# Patient Record
Sex: Female | Born: 1985 | Race: White | Hispanic: No | Marital: Married | Smoking: Never smoker
Health system: Southern US, Community
[De-identification: ages and names within clinical notes are randomized; demographics above are authoritative.]

## PROBLEM LIST (undated history)

## (undated) DIAGNOSIS — R87629 Unspecified abnormal cytological findings in specimens from vagina: Secondary | ICD-10-CM

## (undated) DIAGNOSIS — Z789 Other specified health status: Secondary | ICD-10-CM

## (undated) HISTORY — DX: Unspecified abnormal cytological findings in specimens from vagina: R87.629

## (undated) HISTORY — PX: COLPOSCOPY: SHX161

## (undated) HISTORY — PX: WISDOM TOOTH EXTRACTION: SHX21

## (undated) HISTORY — DX: Other specified health status: Z78.9

## (undated) HISTORY — PX: OTHER SURGICAL HISTORY: SHX169

---

## 2017-01-04 ENCOUNTER — Encounter: Payer: Self-pay | Admitting: Obstetrics & Gynecology

## 2017-01-04 ENCOUNTER — Encounter (INDEPENDENT_AMBULATORY_CARE_PROVIDER_SITE_OTHER): Payer: Self-pay

## 2017-01-04 ENCOUNTER — Ambulatory Visit (INDEPENDENT_AMBULATORY_CARE_PROVIDER_SITE_OTHER): Payer: Managed Care, Other (non HMO) | Admitting: Obstetrics & Gynecology

## 2017-01-04 VITALS — BP 90/59 | HR 70 | Ht 62.0 in | Wt 117.0 lb

## 2017-01-04 DIAGNOSIS — Z3687 Encounter for antenatal screening for uncertain dates: Secondary | ICD-10-CM

## 2017-01-04 DIAGNOSIS — Z124 Encounter for screening for malignant neoplasm of cervix: Secondary | ICD-10-CM

## 2017-01-04 DIAGNOSIS — Z3481 Encounter for supervision of other normal pregnancy, first trimester: Secondary | ICD-10-CM

## 2017-01-04 DIAGNOSIS — Z1151 Encounter for screening for human papillomavirus (HPV): Secondary | ICD-10-CM | POA: Diagnosis not present

## 2017-01-04 DIAGNOSIS — Z3490 Encounter for supervision of normal pregnancy, unspecified, unspecified trimester: Secondary | ICD-10-CM

## 2017-01-04 DIAGNOSIS — Z113 Encounter for screening for infections with a predominantly sexual mode of transmission: Secondary | ICD-10-CM

## 2017-01-04 DIAGNOSIS — Z6791 Unspecified blood type, Rh negative: Secondary | ICD-10-CM

## 2017-01-04 DIAGNOSIS — O26899 Other specified pregnancy related conditions, unspecified trimester: Secondary | ICD-10-CM

## 2017-01-04 NOTE — Progress Notes (Signed)
Bedside U/S shows IUP with FHT of 171 BPM and CRL is 21.61mm.  GA 9w

## 2017-01-04 NOTE — Progress Notes (Signed)
  Subjective:    Amber Underwood is a G2P1001 [redacted]w[redacted]d being seen today for her first obstetrical visit.  Her obstetrical history is significant for NSVD one year ago---shorio during labor.  No probs with infant.. Patient does intend to breast feed. Pregnancy history fully reviewed.  Patient reports no complaints.  Vitals:   01/04/17 0856 01/04/17 0859  BP: (!) 90/59   Pulse: 70   Weight: 117 lb (53.1 kg)   Height:   (1.575 m)    HISTORY: OB History  Gravida Para Term Preterm AB Living  SAB TAB Ectopic Multiple Live Births               # Outcome Date GA Lbr Len/2nd Weight Sex Delivery Anes PTL Lv  2 Current           1 Term    6 lb 12 oz (3.062 kg) M Vag-Spont        Past Medical History:  Diagnosis Date  . Vaginal Pap smear, abnormal    Colpo normal   Past Surgical History:  Procedure Laterality Date  . WISDOM TOOTH EXTRACTION     Family History  Problem Relation Age of Onset  . Hypercholesterolemia Mother   . Prostate cancer Father   . Heart disease Father        Bypass  . Crohn's disease Sister   . Lung cancer Maternal Grandfather   . Breast cancer Paternal Grandmother   . Lung cancer Paternal Grandfather   . Breast cancer Paternal Aunt      Exam    Uterus:     Pelvic Exam:    Perineum: No Hemorrhoids, Normal Perineum   Vulva: normal   Vagina:  normal mucosa, normal discharge   pH: n/a   Cervix: no bleeding following Pap and no lesions   Adnexa: no mass, fullness, tenderness   Bony Pelvis: average  System: Breast:  normal appearance, no masses or tenderness   Skin: normal coloration and turgor, no rashes    Neurologic: oriented, normal mood   Extremities: normal strength, tone, and muscle mass   HEENT sclera clear, anicteric, oropharynx clear, no lesions, neck supple with midline trachea, thyroid without masses and trachea midline   Mouth/Teeth mucous membranes moist, pharynx normal without lesions and dental hygiene good   Neck  supple and no masses   Cardiovascular: regular rate and rhythm   Respiratory:  appears well, vitals normal, no respiratory distress, acyanotic, normal RR, chest clear, no wheezing, crepitations, rhonchi, normal symmetric air entry   Abdomen: soft, non-tender; bowel sounds normal; no masses,  no organomegaly   Urinary: urethral meatus normal      Assessment:    Pregnancy: G2P1001 Patient Active Problem List   Diagnosis Date Noted  . Supervision of low-risk pregnancy, unspecified trimester 01/04/2017        Plan:     Initial labs drawn. Prenatal vitamins. Problem list reviewed and updated. Genetic Screening discussed First Screen: ordered.  Ultrasound discussed; fetal survey: ordered.  Follow up in 4 weeks.  BRX-reduced schedule.  Agrees to weekly weights and BP.  Vegan--watch protein intake, vegan PNV  RH negative--Rhogam with bleeding,  weeks and .     Elsie Lincoln 01/04/2017

## 2017-01-04 NOTE — Progress Notes (Signed)
Pt will be on BRX opt scheduled

## 2017-01-05 LAB — OBSTETRIC PANEL
ANTIBODY SCREEN: NOT DETECTED
BASOS PCT: 0.6 %
Basophils Absolute: 39 cells/uL (ref 0–200)
EOS ABS: 20 {cells}/uL (ref 15–500)
Eosinophils Relative: 0.3 %
HEMATOCRIT: 36.5 % (ref 35.0–45.0)
Hemoglobin: 12.3 g/dL (ref 11.7–15.5)
Hepatitis B Surface Ag: NONREACTIVE
Lymphs Abs: 1976 cells/uL (ref 850–3900)
MCH: 29.4 pg (ref 27.0–33.0)
MCHC: 33.7 g/dL (ref 32.0–36.0)
MCV: 87.1 fL (ref 80.0–100.0)
MPV: 11 fL (ref 7.5–12.5)
Monocytes Relative: 4 %
NEUTROS PCT: 64.7 %
Neutro Abs: 4206 cells/uL (ref 1500–7800)
PLATELETS: 166 10*3/uL (ref 140–400)
RBC: 4.19 10*6/uL (ref 3.80–5.10)
RDW: 12 % (ref 11.0–15.0)
RPR Ser Ql: NONREACTIVE
RUBELLA: 4.76 {index}
TOTAL LYMPHOCYTE: 30.4 %
WBC mixed population: 260 cells/uL (ref 200–950)
WBC: 6.5 10*3/uL (ref 3.8–10.8)

## 2017-01-05 LAB — URINE CULTURE
MICRO NUMBER:: 81073114
RESULT: NO GROWTH
SPECIMEN QUALITY: ADEQUATE

## 2017-01-05 LAB — GC/CHLAMYDIA PROBE AMP (~~LOC~~) NOT AT ARMC
CHLAMYDIA, DNA PROBE: NEGATIVE
NEISSERIA GONORRHEA: NEGATIVE

## 2017-01-05 LAB — HIV ANTIBODY (ROUTINE TESTING W REFLEX): HIV 1&2 Ab, 4th Generation: NONREACTIVE

## 2017-01-08 LAB — CYTOLOGY - PAP
DIAGNOSIS: NEGATIVE
HPV (WINDOPATH): NOT DETECTED

## 2017-01-09 DIAGNOSIS — O26899 Other specified pregnancy related conditions, unspecified trimester: Secondary | ICD-10-CM

## 2017-01-09 DIAGNOSIS — Z6791 Unspecified blood type, Rh negative: Secondary | ICD-10-CM | POA: Insufficient documentation

## 2017-01-25 ENCOUNTER — Telehealth: Payer: Self-pay | Admitting: *Deleted

## 2017-01-25 NOTE — Telephone Encounter (Signed)
-----   Message from Arne ClevelandMandy J Hutchinson, [place] sent at 01/25/2017  1:37 PM EDT ----- This pt is missing an email address in the system and [employer] has not been able to get her registration completed. Can you guys follow up with her to see if she will be able to stay on Optimized Schedule or not and let me know.   Thank you Angelica ChessmanMandy

## 2017-01-25 NOTE — Telephone Encounter (Signed)
LM on voicemail to call office concerning BRX

## 2017-01-29 ENCOUNTER — Encounter (HOSPITAL_COMMUNITY): Payer: Self-pay | Admitting: Obstetrics & Gynecology

## 2017-02-02 ENCOUNTER — Ambulatory Visit (INDEPENDENT_AMBULATORY_CARE_PROVIDER_SITE_OTHER): Payer: Managed Care, Other (non HMO) | Admitting: Advanced Practice Midwife

## 2017-02-02 VITALS — BP 105/66 | HR 63 | Wt 120.0 lb

## 2017-02-02 DIAGNOSIS — Z23 Encounter for immunization: Secondary | ICD-10-CM

## 2017-02-02 DIAGNOSIS — Z3480 Encounter for supervision of other normal pregnancy, unspecified trimester: Secondary | ICD-10-CM

## 2017-02-02 DIAGNOSIS — Z3490 Encounter for supervision of normal pregnancy, unspecified, unspecified trimester: Secondary | ICD-10-CM

## 2017-02-02 DIAGNOSIS — Z3481 Encounter for supervision of other normal pregnancy, first trimester: Secondary | ICD-10-CM

## 2017-02-02 NOTE — Progress Notes (Signed)
   PRENATAL VISIT NOTE  Subjective:  Amber SabinaJessica Underwood is a 31 y.o. G2P1001 at 1840w1d being seen today for ongoing prenatal care.  She is currently monitored for the following issues for this low-risk pregnancy and has Supervision of low-risk pregnancy, unspecified trimester and Rh negative state in antepartum period on her problem list.  Patient reports no complaints.  Contractions: Not present. Vag. Bleeding: None.  Movement: Absent. Denies leaking of fluid.   The following portions of the patient's history were reviewed and updated as appropriate: allergies, current medications, past family history, past medical history, past social history, past surgical history and problem list. Problem list updated.  Objective:   Vitals:   02/02/17 0832  BP: 105/66  Pulse: 63  Weight: 120 lb (54.4 kg)    Fetal Status: Fetal Heart Rate (bpm): 154   Movement: Absent     General:  Alert, oriented and cooperative. Patient is in no acute distress.  Skin: Skin is warm and dry. No rash noted.   Cardiovascular: Normal heart rate noted  Respiratory: Normal respiratory effort, no problems with respiration noted  Abdomen: Soft, gravid, appropriate for gestational age.  Pain/Pressure: Absent     Pelvic: Cervical exam deferred        Extremities: Normal range of motion.  Edema: None  Mental Status:  Normal mood and affect. Normal behavior. Normal judgment and thought content.   Assessment and Plan:  Pregnancy: G2P1001 at 4040w1d  1. Supervision of other normal pregnancy, antepartum - Flu vax today  - US MFM OB COMP ADDL GEST + 14 WK; Future  Preterm labor symptoms and general obstetric precautions including but not limited to vaginal bleeding, contractions, leaking of fluid and fetal movement were reviewed in detail with the patient. Please refer to After Visit Summary for other counseling recommendations.  Return in about 4 weeks (around 03/02/2017).   Thressa ShellerHeather Hogan, CNM

## 2017-02-05 ENCOUNTER — Ambulatory Visit (HOSPITAL_COMMUNITY): Payer: Managed Care, Other (non HMO)

## 2017-02-05 NOTE — Addendum Note (Signed)
Addended by: Kathie DikeSOLA, Kycen Spalla J on: 02/05/2017 08:33 AM   Modules accepted: Orders

## 2017-02-06 ENCOUNTER — Encounter (HOSPITAL_COMMUNITY): Payer: Self-pay

## 2017-02-06 ENCOUNTER — Ambulatory Visit (HOSPITAL_COMMUNITY)
Admission: RE | Admit: 2017-02-06 | Discharge: 2017-02-06 | Disposition: A | Discharge status: Home / Self Care | Payer: Managed Care, Other (non HMO) | Source: Ambulatory Visit | Attending: Obstetrics & Gynecology | Admitting: Obstetrics & Gynecology

## 2017-02-06 ENCOUNTER — Other Ambulatory Visit: Payer: Self-pay | Admitting: Obstetrics & Gynecology

## 2017-02-06 DIAGNOSIS — Z3A14 14 weeks gestation of pregnancy: Secondary | ICD-10-CM

## 2017-02-06 DIAGNOSIS — Z3490 Encounter for supervision of normal pregnancy, unspecified, unspecified trimester: Secondary | ICD-10-CM

## 2017-02-06 DIAGNOSIS — Z3682 Encounter for antenatal screening for nuchal translucency: Secondary | ICD-10-CM | POA: Diagnosis not present

## 2017-02-08 ENCOUNTER — Other Ambulatory Visit: Payer: Self-pay

## 2017-02-12 ENCOUNTER — Encounter: Payer: Self-pay | Admitting: *Deleted

## 2017-02-12 DIAGNOSIS — Z3490 Encounter for supervision of normal pregnancy, unspecified, unspecified trimester: Secondary | ICD-10-CM

## 2017-02-19 DIAGNOSIS — Z3490 Encounter for supervision of normal pregnancy, unspecified, unspecified trimester: Secondary | ICD-10-CM

## 2017-03-15 ENCOUNTER — Ambulatory Visit (HOSPITAL_COMMUNITY): Payer: Managed Care, Other (non HMO)

## 2017-03-22 ENCOUNTER — Ambulatory Visit (HOSPITAL_COMMUNITY)
Admission: RE | Admit: 2017-03-22 | Discharge: 2017-03-22 | Disposition: A | Discharge status: Home / Self Care | Payer: Managed Care, Other (non HMO) | Source: Ambulatory Visit | Attending: Obstetrics & Gynecology | Admitting: Obstetrics & Gynecology

## 2017-03-22 ENCOUNTER — Ambulatory Visit (HOSPITAL_COMMUNITY): Payer: Managed Care, Other (non HMO)

## 2017-03-22 ENCOUNTER — Other Ambulatory Visit: Payer: Self-pay | Admitting: Obstetrics & Gynecology

## 2017-03-22 DIAGNOSIS — Z3A20 20 weeks gestation of pregnancy: Secondary | ICD-10-CM

## 2017-03-22 DIAGNOSIS — Z363 Encounter for antenatal screening for malformations: Secondary | ICD-10-CM

## 2017-03-22 DIAGNOSIS — Z3480 Encounter for supervision of other normal pregnancy, unspecified trimester: Secondary | ICD-10-CM | POA: Insufficient documentation

## 2017-03-22 DIAGNOSIS — Z3689 Encounter for other specified antenatal screening: Secondary | ICD-10-CM

## 2017-03-22 DIAGNOSIS — O30009 Twin pregnancy, unspecified number of placenta and unspecified number of amniotic sacs, unspecified trimester: Secondary | ICD-10-CM

## 2017-03-30 ENCOUNTER — Ambulatory Visit (INDEPENDENT_AMBULATORY_CARE_PROVIDER_SITE_OTHER): Payer: Managed Care, Other (non HMO) | Admitting: Advanced Practice Midwife

## 2017-03-30 VITALS — BP 103/66 | HR 68 | Wt 130.0 lb

## 2017-03-30 DIAGNOSIS — Z3480 Encounter for supervision of other normal pregnancy, unspecified trimester: Secondary | ICD-10-CM

## 2017-03-30 MED ORDER — VITAMEDMD ONE RX/QUATREFOLIC 30-0.6-0.4-200 MG PO CAPS: 1.0000 | ORAL_CAPSULE | Freq: Every day | ORAL | 12 refills | Status: AC

## 2017-03-30 NOTE — Progress Notes (Signed)
   PRENATAL VISIT NOTE  Subjective:  Amber Underwood is a 31 y.o. G2P1001 at 7471w1d being seen today for ongoing prenatal care.  She is currently monitored for the following issues for this low-risk pregnancy and has Supervision of low-risk pregnancy, unspecified trimester and Rh negative state in antepartum period on their problem list.  Patient reports no complaints.  Contractions: Not present. Vag. Bleeding: None.  Movement: Present. Denies leaking of fluid.   The following portions of the patient's history were reviewed and updated as appropriate: allergies, current medications, past family history, past medical history, past social history, past surgical history and problem list. Problem list updated.  Objective:   Vitals:   03/30/17 0827  BP: 103/66  Pulse: 68  Weight: 130 lb (59 kg)    Fetal Status: Fetal Heart Rate (bpm): 144   Movement: Present     General:  Alert, oriented and cooperative. Patient is in no acute distress.  Skin: Skin is warm and dry. No rash noted.   Cardiovascular: Normal heart rate noted  Respiratory: Normal respiratory effort, no problems with respiration noted  Abdomen: Soft, gravid, appropriate for gestational age.  Pain/Pressure: Absent     Pelvic: Cervical exam deferred        Extremities: Normal range of motion.  Edema: None  Mental Status:  Normal mood and affect. Normal behavior. Normal judgment and thought content.   Assessment and Plan:  Pregnancy: G2P1001 at 3671w1d  1. Supervision of other normal pregnancy, antepartum - Prenat w/o A-FE-Methfol-FA-DHA (VITAMEDMD ONE RX/QUATREFOLIC) 30-0.6-0.4-200 MG CAPS; Take 1 tablet by mouth daily.  Dispense: 30 capsule; Refill: 12 - US MFM OB FOLLOW UP; Future - scheduled for 04/20/16 for complete anatomy evaluation  -Educated and discussed fetal movement and what to expect at this stage of pregnancy.   Preterm labor symptoms and general obstetric precautions including but not limited to vaginal bleeding,  contractions, leaking of fluid and fetal movement were reviewed in detail with the patient. Please refer to After Visit Summary for other counseling recommendations.  Return in about 7 weeks (around 05/18/2017) for ROB.   Sharyon CableVeronica C Joani Cosma, CNM

## 2017-03-30 NOTE — Patient Instructions (Signed)
Second Trimester of Pregnancy The second trimester is from week 13 through week 28, month 4 through 6. This is often the time in pregnancy that you feel your best. Often times, morning sickness has lessened or quit. You may have more energy, and you may get hungry more often. Your unborn baby (fetus) is growing rapidly. At the end of the sixth month, he or she is about 9 inches long and weighs about 1 pounds. You will likely feel the baby move (quickening) between 18 and 20 weeks of pregnancy. Follow these instructions at home:  Avoid all smoking, herbs, and alcohol. Avoid drugs not approved by your doctor.  Do not use any tobacco products, including cigarettes, chewing tobacco, and electronic cigarettes. If you need help quitting, ask your doctor. You may get counseling or other support to help you quit.  Only take medicine as told by your doctor. Some medicines are safe and some are not during pregnancy.  Exercise only as told by your doctor. Stop exercising if you start having cramps.  Eat regular, healthy meals.  Wear a good support bra if your breasts are tender.  Do not use hot tubs, steam rooms, or saunas.  Wear your seat belt when driving.  Avoid raw meat, uncooked cheese, and liter boxes and soil used by cats.  Take your prenatal vitamins.  Take 1500-2000 milligrams of calcium daily starting at the 20th week of pregnancy until you deliver your baby.  Try taking medicine that helps you poop (stool softener) as needed, and if your doctor approves. Eat more fiber by eating fresh fruit, vegetables, and whole grains. Drink enough fluids to keep your pee (urine) clear or pale yellow.  Take warm water baths (sitz baths) to soothe pain or discomfort caused by hemorrhoids. Use hemorrhoid cream if your doctor approves.  If you have puffy, bulging veins (varicose veins), wear support hose. Raise (elevate) your feet for 15 minutes, 3-4 times a day. Limit salt in your diet.  Avoid heavy  lifting, wear low heals, and sit up straight.  Rest with your legs raised if you have leg cramps or low back pain.  Visit your dentist if you have not gone during your pregnancy. Use a soft toothbrush to brush your teeth. Be gentle when you floss.  You can have sex (intercourse) unless your doctor tells you not to.  Go to your doctor visits. Get help if:  You feel dizzy.  You have mild cramps or pressure in your lower belly (abdomen).  You have a nagging pain in your belly area.  You continue to feel sick to your stomach (nauseous), throw up (vomit), or have watery poop (diarrhea).  You have bad smelling fluid coming from your vagina.  You have pain with peeing (urination). Get help right away if:  You have a fever.  You are leaking fluid from your vagina.  You have spotting or bleeding from your vagina.  You have severe belly cramping or pain.  You lose or gain weight rapidly.  You have trouble catching your breath and have chest pain.  You notice sudden or extreme puffiness (swelling) of your face, hands, ankles, feet, or legs.  You have not felt the baby move in over an hour.  You have severe headaches that do not go away with medicine.  You have vision changes. This information is not intended to replace advice given to you by your health care provider. Make sure you discuss any questions you have with your health care   provider. Document Released: 06/21/2009 Document Revised: 09/02/2015 Document Reviewed: 05/28/2012 Elsevier Interactive Patient Education  2017 Elsevier Inc.  

## 2017-04-20 ENCOUNTER — Ambulatory Visit (HOSPITAL_COMMUNITY): Payer: Managed Care, Other (non HMO)

## 2017-04-26 ENCOUNTER — Ambulatory Visit (HOSPITAL_COMMUNITY)
Admission: RE | Admit: 2017-04-26 | Discharge: 2017-04-26 | Disposition: A | Discharge status: Home / Self Care | Payer: Managed Care, Other (non HMO) | Source: Ambulatory Visit | Attending: Certified Nurse Midwife | Admitting: Certified Nurse Midwife

## 2017-04-26 ENCOUNTER — Other Ambulatory Visit: Payer: Self-pay | Admitting: Certified Nurse Midwife

## 2017-04-26 DIAGNOSIS — Z362 Encounter for other antenatal screening follow-up: Secondary | ICD-10-CM

## 2017-04-26 DIAGNOSIS — Z3480 Encounter for supervision of other normal pregnancy, unspecified trimester: Secondary | ICD-10-CM | POA: Diagnosis present

## 2017-04-26 DIAGNOSIS — Z3A25 25 weeks gestation of pregnancy: Secondary | ICD-10-CM | POA: Diagnosis not present

## 2017-05-18 ENCOUNTER — Encounter: Payer: Self-pay | Admitting: Certified Nurse Midwife

## 2017-05-18 ENCOUNTER — Ambulatory Visit (INDEPENDENT_AMBULATORY_CARE_PROVIDER_SITE_OTHER): Payer: Managed Care, Other (non HMO) | Admitting: Certified Nurse Midwife

## 2017-05-18 VITALS — BP 102/61 | HR 76 | Wt 136.0 lb

## 2017-05-18 DIAGNOSIS — Z3480 Encounter for supervision of other normal pregnancy, unspecified trimester: Secondary | ICD-10-CM

## 2017-05-18 DIAGNOSIS — O360930 Maternal care for other rhesus isoimmunization, third trimester, not applicable or unspecified: Secondary | ICD-10-CM | POA: Diagnosis not present

## 2017-05-18 DIAGNOSIS — M549 Dorsalgia, unspecified: Secondary | ICD-10-CM

## 2017-05-18 DIAGNOSIS — Z3483 Encounter for supervision of other normal pregnancy, third trimester: Secondary | ICD-10-CM | POA: Diagnosis not present

## 2017-05-18 DIAGNOSIS — O9989 Other specified diseases and conditions complicating pregnancy, childbirth and the puerperium: Secondary | ICD-10-CM | POA: Diagnosis not present

## 2017-05-18 DIAGNOSIS — Z3490 Encounter for supervision of normal pregnancy, unspecified, unspecified trimester: Secondary | ICD-10-CM

## 2017-05-18 DIAGNOSIS — O99891 Other specified diseases and conditions complicating pregnancy: Secondary | ICD-10-CM

## 2017-05-18 DIAGNOSIS — Z23 Encounter for immunization: Secondary | ICD-10-CM

## 2017-05-18 MED ORDER — RHO D IMMUNE GLOBULIN 1500 UNIT/2ML IJ SOSY
300.0000 ug | PREFILLED_SYRINGE | Freq: Once | INTRAMUSCULAR | Status: AC
  Administered 2017-05-18: 300 ug via INTRAMUSCULAR

## 2017-05-18 NOTE — Patient Instructions (Signed)

## 2017-05-18 NOTE — Progress Notes (Signed)
   PRENATAL VISIT NOTE  Subjective:  Amber Underwood is a 32 y.o. G2P1001 at 3664w1d being seen today for ongoing prenatal care.  She is currently monitored for the following issues for this low-risk pregnancy and has Supervision of low-risk pregnancy, unspecified trimester and Rh negative state in antepartum period on their problem list.  Patient reports backache. Pt states she had back pain with her first pregnancy as well once she got into her third trimester. Reports back pain being better with positions- not complicating sleep. Contractions: Not present. Vag. Bleeding: None.  Movement: Present. Denies leaking of fluid.   The following portions of the patient's history were reviewed and updated as appropriate: allergies, current medications, past family history, past medical history, past social history, past surgical history and problem list. Problem list updated.  Objective:   Vitals:   05/18/17 0835  BP: 102/61  Pulse: 76  Weight: 136 lb (61.7 kg)    Fetal Status: Fetal Heart Rate (bpm): 132 Fundal Height: 27 cm Movement: Present     General:  Alert, oriented and cooperative. Patient is in no acute distress.  Skin: Skin is warm and dry. No rash noted.   Cardiovascular: Normal heart rate noted  Respiratory: Normal respiratory effort, no problems with respiration noted  Abdomen: Soft, gravid, appropriate for gestational age.  Pain/Pressure: Present     Pelvic: Cervical exam deferred        Extremities: Normal range of motion.  Edema: None  Mental Status:  Normal mood and affect. Normal behavior. Normal judgment and thought content.   Assessment and Plan:  Pregnancy: G2P1001 at 6064w1d  1. Supervision of other normal pregnancy, antepartum - 2Hr GTT w/ 1 Hr Carpenter 75 g - Tdap vaccine greater than or equal to 7yo IM - HIV antibody (with reflex) - CBC - RPR - rho (d) immune globulin (RHIG/RHOPHYLAC) injection 300 mcg - Antibody screen  2. Supervision of low-risk pregnancy,  unspecified trimester - Pt is concerned with how big the practice is and seeing a "stranger" when it comes to labor- pt had a difficult last labor due to this situation. Pt would like to see a familiar face in labor. Reviewed practice set up as discussed in initial prenatal. Addressed that provider can agree to labor patient as long as it is mutual.  -Anticipatory guidance on next prenatal visit, [employer] optimization patient  3. Back pain affecting pregnancy in third trimester -Discussed positioning to decreased back pain. Tylenol as a pain relief methods. Offered pregnancy support belt for use daily- patient declined.   Preterm labor symptoms and general obstetric precautions including but not limited to vaginal bleeding, contractions, leaking of fluid and fetal movement were reviewed in detail with the patient. Please refer to After Visit Summary for other counseling recommendations.  Return in about 4 weeks (around 06/15/2017) for ROB.   Sharyon CableVeronica C Oneisha Ammons, CNM

## 2017-05-21 ENCOUNTER — Other Ambulatory Visit: Payer: Self-pay | Admitting: Certified Nurse Midwife

## 2017-05-21 DIAGNOSIS — O24419 Gestational diabetes mellitus in pregnancy, unspecified control: Secondary | ICD-10-CM | POA: Insufficient documentation

## 2017-05-21 DIAGNOSIS — O24410 Gestational diabetes mellitus in pregnancy, diet controlled: Secondary | ICD-10-CM

## 2017-05-21 DIAGNOSIS — Z3480 Encounter for supervision of other normal pregnancy, unspecified trimester: Secondary | ICD-10-CM

## 2017-05-21 LAB — CBC
HCT: 35.9 % (ref 35.0–45.0)
Hemoglobin: 12.2 g/dL (ref 11.7–15.5)
MCH: 31 pg (ref 27.0–33.0)
MCHC: 34 g/dL (ref 32.0–36.0)
MCV: 91.1 fL (ref 80.0–100.0)
MPV: 9.5 fL (ref 7.5–12.5)
Platelets: 224 10*3/uL (ref 140–400)
RBC: 3.94 10*6/uL (ref 3.80–5.10)
RDW: 11.4 % (ref 11.0–15.0)
WBC: 8.9 10*3/uL (ref 3.8–10.8)

## 2017-05-21 LAB — ANTIBODY SCREEN: Antibody Screen: NOT DETECTED

## 2017-05-21 LAB — 2HR GTT W 1 HR, CARPENTER, 75 G
Glucose, 1 Hr, Gest: 177 mg/dL (ref 65–179)
Glucose, 2 Hr, Gest: 161 mg/dL — ABNORMAL HIGH (ref 65–152)
Glucose, Fasting, Gest: 72 mg/dL (ref 65–91)

## 2017-05-21 LAB — HIV ANTIBODY (ROUTINE TESTING W REFLEX): HIV 1&2 Ab, 4th Generation: NONREACTIVE

## 2017-05-21 LAB — RPR: RPR Ser Ql: NONREACTIVE

## 2017-05-23 ENCOUNTER — Telehealth: Payer: Self-pay | Admitting: *Deleted

## 2017-05-23 ENCOUNTER — Other Ambulatory Visit: Payer: Self-pay | Admitting: Certified Nurse Midwife

## 2017-05-23 DIAGNOSIS — O24410 Gestational diabetes mellitus in pregnancy, diet controlled: Secondary | ICD-10-CM

## 2017-05-23 MED ORDER — ACCU-CHEK FASTCLIX LANCETS MISC: 1.0000 | Freq: Four times a day (QID) | 12 refills | Status: DC

## 2017-05-23 MED ORDER — ACCU-CHEK NANO SMARTVIEW W/DEVICE KIT: 1.0000 | PACK | 0 refills | Status: DC

## 2017-05-23 MED ORDER — BLOOD GLUCOSE MONITOR KIT: PACK | 0 refills | Status: DC

## 2017-05-23 MED ORDER — GLUCOSE BLOOD VI STRP: ORAL_STRIP | 12 refills | Status: DC

## 2017-05-23 NOTE — Telephone Encounter (Signed)
Pharmacy called stating that pt's insurance would only cover One Touch glucose testing supplies.

## 2017-05-23 NOTE — Progress Notes (Signed)
Changes made to glucose monitor and added lancets and strips  

## 2017-07-12 LAB — OB RESULTS CONSOLE GBS: STREP GROUP B AG: NEGATIVE

## 2017-08-06 ENCOUNTER — Encounter (HOSPITAL_COMMUNITY): Payer: Self-pay | Admitting: *Deleted

## 2017-08-06 ENCOUNTER — Telehealth (HOSPITAL_COMMUNITY): Payer: Self-pay | Admitting: *Deleted

## 2017-08-06 NOTE — Telephone Encounter (Signed)
Preadmission screen  

## 2017-08-10 ENCOUNTER — Inpatient Hospital Stay (HOSPITAL_COMMUNITY): Admission: RE | Admit: 2017-08-10 | Payer: Managed Care, Other (non HMO) | Source: Ambulatory Visit

## 2017-08-13 ENCOUNTER — Inpatient Hospital Stay (HOSPITAL_COMMUNITY): Admission: RE | Admit: 2017-08-13 | Payer: Managed Care, Other (non HMO) | Source: Ambulatory Visit

## 2017-08-15 NOTE — H&P (Signed)
Amber Underwood is a 32 y.o. female presenting for post dates IOL. Prenatal care uncomplicated. Transferred to our office at approximately [redacted] weeks EGA. U/S in office on 08/09/17 C/W EFW 7# 7oz, vertex. OB History    Gravida  2   Para  1   Term  1   Preterm      AB      Living  1     SAB      TAB      Ectopic      Multiple      Live Births  1          Past Medical History:  Diagnosis Date  . Vaginal Pap smear, abnormal    Colpo normal   Past Surgical History:  Procedure Laterality Date  . COLPOSCOPY    . WISDOM TOOTH EXTRACTION     Family History: family history includes Breast cancer in her paternal aunt; Crohn's disease in her sister; Heart disease in her father; Hypercholesterolemia in her mother; Lung cancer in her maternal grandfather and paternal grandfather; Prostate cancer in her father. Social History:  reports that she has never smoked. She has never used smokeless tobacco. She reports that she does not drink alcohol or use drugs.     Maternal Diabetes: No Genetic Screening: Normal Maternal Ultrasounds/Referrals: Normal Fetal Ultrasounds or other Referrals:  None Maternal Substance Abuse:  No Significant Maternal Medications:  None Significant Maternal Lab Results:  None Other Comments:  None  ROS History   Last menstrual period 10/23/2016. Exam Physical Exam  Cardiovascular: Normal rate.  Respiratory: Effort normal.  GI: Soft.    Prenatal labs: ABO, Rh: B/RH (D) NEGATIVE/-- (09/27 0942) Antibody: NO ANTIBODIES DETECTED (02/08 0916) Rubella: 4.76 (09/27 0942) RPR: NON-REACTIVE (02/08 0917)  HBsAg: NON-REACTIVE (09/27 0942)  HIV: NON-REACTIVE (02/08 0917)  GBS:     Assessment/Plan: 32 yo G2P1 @ 41 0/7 weeks for IOL   Amber Underwood II 08/15/2017, 1:42 PM

## 2017-08-16 ENCOUNTER — Inpatient Hospital Stay (HOSPITAL_COMMUNITY): Admission: RE | Admit: 2017-08-16 | Payer: Managed Care, Other (non HMO) | Source: Ambulatory Visit

## 2017-08-16 ENCOUNTER — Encounter (HOSPITAL_COMMUNITY): Payer: Self-pay | Admitting: *Deleted

## 2017-08-16 ENCOUNTER — Inpatient Hospital Stay (HOSPITAL_COMMUNITY): Payer: Managed Care, Other (non HMO) | Admitting: Anesthesiology

## 2017-08-16 ENCOUNTER — Other Ambulatory Visit: Payer: Self-pay

## 2017-08-16 ENCOUNTER — Inpatient Hospital Stay (HOSPITAL_COMMUNITY)
Admission: AD | Admit: 2017-08-16 | Discharge: 2017-08-17 | DRG: 807 | Disposition: A | Discharge status: Home / Self Care | Payer: Managed Care, Other (non HMO) | Source: Ambulatory Visit | Attending: Obstetrics and Gynecology | Admitting: Obstetrics and Gynecology

## 2017-08-16 DIAGNOSIS — Z3A41 41 weeks gestation of pregnancy: Secondary | ICD-10-CM

## 2017-08-16 DIAGNOSIS — O24410 Gestational diabetes mellitus in pregnancy, diet controlled: Secondary | ICD-10-CM

## 2017-08-16 DIAGNOSIS — Z3490 Encounter for supervision of normal pregnancy, unspecified, unspecified trimester: Secondary | ICD-10-CM

## 2017-08-16 DIAGNOSIS — O480 Post-term pregnancy: Principal | ICD-10-CM | POA: Diagnosis present

## 2017-08-16 LAB — CBC
HEMATOCRIT: 39.5 % (ref 36.0–46.0)
HEMOGLOBIN: 13.3 g/dL (ref 12.0–15.0)
MCH: 31.2 pg (ref 26.0–34.0)
MCHC: 33.7 g/dL (ref 30.0–36.0)
MCV: 92.7 fL (ref 78.0–100.0)
Platelets: 205 10*3/uL (ref 150–400)
RBC: 4.26 MIL/uL (ref 3.87–5.11)
RDW: 12.5 % (ref 11.5–15.5)
WBC: 9.4 10*3/uL (ref 4.0–10.5)

## 2017-08-16 LAB — ABO/RH: ABO/RH(D): B NEG

## 2017-08-16 LAB — TYPE AND SCREEN
ABO/RH(D): B NEG
ANTIBODY SCREEN: NEGATIVE

## 2017-08-16 LAB — RPR: RPR: NONREACTIVE

## 2017-08-16 MED ORDER — OXYTOCIN BOLUS FROM INFUSION
500.0000 mL | Freq: Once | INTRAVENOUS | Status: AC
  Administered 2017-08-16: 500 mL via INTRAVENOUS

## 2017-08-16 MED ORDER — OXYCODONE-ACETAMINOPHEN 5-325 MG PO TABS: 2.0000 | ORAL_TABLET | ORAL | Status: DC | PRN

## 2017-08-16 MED ORDER — LACTATED RINGERS IV SOLN: 500.0000 mL | INTRAVENOUS | Status: DC | PRN

## 2017-08-16 MED ORDER — LACTATED RINGERS IV SOLN
INTRAVENOUS | Status: DC
  Administered 2017-08-16: 05:00:00 via INTRAVENOUS

## 2017-08-16 MED ORDER — LACTATED RINGERS IV SOLN: 500.0000 mL | Freq: Once | INTRAVENOUS | Status: DC

## 2017-08-16 MED ORDER — PHENYLEPHRINE 40 MCG/ML (10ML) SYRINGE FOR IV PUSH (FOR BLOOD PRESSURE SUPPORT)
PREFILLED_SYRINGE | INTRAVENOUS | Status: AC
  Filled 2017-08-16: qty 20

## 2017-08-16 MED ORDER — ACETAMINOPHEN 325 MG PO TABS
650.0000 mg | ORAL_TABLET | ORAL | Status: DC | PRN
  Administered 2017-08-16 – 2017-08-17 (×6): 650 mg via ORAL
  Filled 2017-08-16 (×6): qty 2

## 2017-08-16 MED ORDER — LIDOCAINE HCL (PF) 1 % IJ SOLN
INTRAMUSCULAR | Status: DC | PRN
  Administered 2017-08-16 (×2): 4 mL via EPIDURAL

## 2017-08-16 MED ORDER — FLEET ENEMA 7-19 GM/118ML RE ENEM: 1.0000 | ENEMA | RECTAL | Status: DC | PRN

## 2017-08-16 MED ORDER — ZOLPIDEM TARTRATE 5 MG PO TABS: 5.0000 mg | ORAL_TABLET | Freq: Every evening | ORAL | Status: DC | PRN

## 2017-08-16 MED ORDER — LACTATED RINGERS IV SOLN
500.0000 mL | Freq: Once | INTRAVENOUS | Status: AC
  Administered 2017-08-16: 500 mL via INTRAVENOUS

## 2017-08-16 MED ORDER — WITCH HAZEL-GLYCERIN EX PADS: 1.0000 "application " | MEDICATED_PAD | CUTANEOUS | Status: DC | PRN

## 2017-08-16 MED ORDER — BENZOCAINE-MENTHOL 20-0.5 % EX AERO
1.0000 "application " | INHALATION_SPRAY | CUTANEOUS | Status: DC | PRN
  Administered 2017-08-16: 1 via TOPICAL
  Filled 2017-08-16 (×2): qty 56

## 2017-08-16 MED ORDER — PHENYLEPHRINE 40 MCG/ML (10ML) SYRINGE FOR IV PUSH (FOR BLOOD PRESSURE SUPPORT): 80.0000 ug | PREFILLED_SYRINGE | INTRAVENOUS | Status: DC | PRN

## 2017-08-16 MED ORDER — ONDANSETRON HCL 4 MG/2ML IJ SOLN: 4.0000 mg | INTRAMUSCULAR | Status: DC | PRN

## 2017-08-16 MED ORDER — SOD CITRATE-CITRIC ACID 500-334 MG/5ML PO SOLN: 30.0000 mL | ORAL | Status: DC | PRN

## 2017-08-16 MED ORDER — DIBUCAINE 1 % RE OINT
1.0000 "application " | TOPICAL_OINTMENT | RECTAL | Status: DC | PRN
  Filled 2017-08-16: qty 28

## 2017-08-16 MED ORDER — TETANUS-DIPHTH-ACELL PERTUSSIS 5-2.5-18.5 LF-MCG/0.5 IM SUSP
0.5000 mL | Freq: Once | INTRAMUSCULAR | Status: DC
Start: 2017-08-17 — End: 2017-08-16

## 2017-08-16 MED ORDER — FENTANYL 2.5 MCG/ML BUPIVACAINE 1/10 % EPIDURAL INFUSION (WH - ANES)
14.0000 mL/h | INTRAMUSCULAR | Status: DC | PRN
  Administered 2017-08-16: 12.5 mL/h via EPIDURAL

## 2017-08-16 MED ORDER — PRENATAL MULTIVITAMIN CH
1.0000 | ORAL_TABLET | Freq: Every day | ORAL | Status: DC
  Administered 2017-08-17: 1 via ORAL
  Filled 2017-08-16: qty 1

## 2017-08-16 MED ORDER — SENNOSIDES-DOCUSATE SODIUM 8.6-50 MG PO TABS
2.0000 | ORAL_TABLET | ORAL | Status: DC
  Administered 2017-08-16: 2 via ORAL
  Filled 2017-08-16: qty 2

## 2017-08-16 MED ORDER — EPHEDRINE 5 MG/ML INJ: 10.0000 mg | INTRAVENOUS | Status: DC | PRN

## 2017-08-16 MED ORDER — OXYCODONE HCL 5 MG PO TABS: 10.0000 mg | ORAL_TABLET | ORAL | Status: DC | PRN

## 2017-08-16 MED ORDER — LIDOCAINE HCL (PF) 1 % IJ SOLN
30.0000 mL | INTRAMUSCULAR | Status: DC | PRN
  Filled 2017-08-16: qty 30

## 2017-08-16 MED ORDER — ACETAMINOPHEN 325 MG PO TABS: 650.0000 mg | ORAL_TABLET | ORAL | Status: DC | PRN

## 2017-08-16 MED ORDER — OXYCODONE HCL 5 MG PO TABS: 5.0000 mg | ORAL_TABLET | ORAL | Status: DC | PRN

## 2017-08-16 MED ORDER — DIPHENHYDRAMINE HCL 50 MG/ML IJ SOLN: 12.5000 mg | INTRAMUSCULAR | Status: DC | PRN

## 2017-08-16 MED ORDER — OXYCODONE-ACETAMINOPHEN 5-325 MG PO TABS: 1.0000 | ORAL_TABLET | ORAL | Status: DC | PRN

## 2017-08-16 MED ORDER — ONDANSETRON HCL 4 MG PO TABS: 4.0000 mg | ORAL_TABLET | ORAL | Status: DC | PRN

## 2017-08-16 MED ORDER — DIPHENHYDRAMINE HCL 25 MG PO CAPS: 25.0000 mg | ORAL_CAPSULE | Freq: Four times a day (QID) | ORAL | Status: DC | PRN

## 2017-08-16 MED ORDER — ONDANSETRON HCL 4 MG/2ML IJ SOLN: 4.0000 mg | Freq: Four times a day (QID) | INTRAMUSCULAR | Status: DC | PRN

## 2017-08-16 MED ORDER — COCONUT OIL OIL
1.0000 "application " | TOPICAL_OIL | Status: DC | PRN
  Filled 2017-08-16: qty 120

## 2017-08-16 MED ORDER — SIMETHICONE 80 MG PO CHEW: 80.0000 mg | CHEWABLE_TABLET | ORAL | Status: DC | PRN

## 2017-08-16 MED ORDER — OXYTOCIN 40 UNITS IN LACTATED RINGERS INFUSION - SIMPLE MED
2.5000 [IU]/h | INTRAVENOUS | Status: DC
  Filled 2017-08-16: qty 1000

## 2017-08-16 MED ORDER — FENTANYL 2.5 MCG/ML BUPIVACAINE 1/10 % EPIDURAL INFUSION (WH - ANES)
INTRAMUSCULAR | Status: AC
  Filled 2017-08-16: qty 100

## 2017-08-16 NOTE — MAU Note (Signed)
Pt reports contractions since 2am every 2-5 mins. Pt denies LOF or vaginal bleeding. Reports good fetal movement. Was 2cm last week in the office

## 2017-08-16 NOTE — Progress Notes (Signed)
Delivery Note At 10:26 AM a viable female was delivered via Vaginal, Spontaneous (Presentation:LOA ;  ).  APGAR: 8, 9; weight pending .   Placenta status:intact , .  Cord: 3 vssels with the following complications: .  Cord pH: pending  Anesthesia:   Episiotomy: None Lacerations: 2nd degree;anterior midline below clitoris extending to left margin of urethra. Closed and in/out cath done>urethra patent Suture Repair: 3.0 vicryl rapide Est. Blood Loss (mL):  400  Mom to postpartum.  Baby to Couplet care / Skin to Skin.  Roselle Locus II 08/16/2017, 10:43 AM

## 2017-08-16 NOTE — Progress Notes (Signed)
Admitted in night for SOL Comfortable with epidural Cx 7/C/-2/vtx AROM clear FHT cat one

## 2017-08-16 NOTE — Anesthesia Procedure Notes (Signed)
Epidural Patient location during procedure: OB Start time: 08/16/2017 6:38 AM  Staffing Anesthesiologist: Mal Amabile, MD Performed: anesthesiologist   Preanesthetic Checklist Completed: patient identified, site marked, surgical consent, pre-op evaluation, timeout performed, IV checked, risks and benefits discussed and monitors and equipment checked  Epidural Patient position: sitting Prep: site prepped and draped and DuraPrep Patient monitoring: continuous pulse ox and blood pressure Approach: midline Location: L3-L4 Injection technique: LOR air  Needle:  Needle type: Tuohy  Needle gauge: 17 G Needle length: 9 cm and 9 Needle insertion depth: 4 cm Catheter type: closed end flexible Catheter size: 19 Gauge Catheter at skin depth: 9 cm Test dose: negative and Other  Assessment Events: blood not aspirated, injection not painful, no injection resistance, negative IV test and no paresthesia  Additional Notes Patient identified. Risks and benefits discussed including failed block, incomplete  Pain control, post dural puncture headache, nerve damage, paralysis, blood pressure Changes, nausea, vomiting, reactions to medications-both toxic and allergic and post Partum back pain. All questions were answered. Patient expressed understanding and wished to proceed. Sterile technique was used throughout procedure. Epidural site was Dressed with sterile barrier dressing. No paresthesias, signs of intravascular injection Or signs of intrathecal spread were encountered.  Patient was more comfortable after the epidural was dosed. Please see RN's note for documentation of vital signs and FHR which are stable.

## 2017-08-16 NOTE — Anesthesia Preprocedure Evaluation (Addendum)
Anesthesia Evaluation  Patient identified by MRN, date of birth, ID band Patient awake    Reviewed: Allergy & Precautions, Patient's Chart, lab work & pertinent test results  Airway Mallampati: II  TM Distance: >3 FB Neck ROM: Full    Dental  (+) Teeth Intact   Pulmonary neg pulmonary ROS,    Pulmonary exam normal breath sounds clear to auscultation       Cardiovascular negative cardio ROS Normal cardiovascular exam Rhythm:Regular Rate:Normal     Neuro/Psych negative neurological ROS  negative psych ROS   GI/Hepatic Neg liver ROS, GERD  ,  Endo/Other  diabetes, Gestational  Renal/GU negative Renal ROS  negative genitourinary   Musculoskeletal negative musculoskeletal ROS (+)   Abdominal   Peds  Hematology negative hematology ROS (+)   Anesthesia Other Findings   Reproductive/Obstetrics (+) Pregnancy                            Anesthesia Physical Anesthesia Plan  ASA: II  Anesthesia Plan:    Post-op Pain Management:    Induction:   PONV Risk Score and Plan:   Airway Management Planned: Natural Airway  Additional Equipment:   Intra-op Plan:   Post-operative Plan:   Informed Consent: I have reviewed the patients History and Physical, chart, labs and discussed the procedure including the risks, benefits and alternatives for the proposed anesthesia with the patient or authorized representative who has indicated his/her understanding and acceptance.     Plan Discussed with: Anesthesiologist  Anesthesia Plan Comments:         Anesthesia Quick Evaluation

## 2017-08-16 NOTE — Anesthesia Postprocedure Evaluation (Signed)
Anesthesia Post Note  Patient: Amber Underwood  Procedure(s) Performed: AN AD HOC LABOR EPIDURAL     Patient location during evaluation: Mother Baby Anesthesia Type: Epidural Level of consciousness: awake and alert Pain management: pain level controlled Vital Signs Assessment: post-procedure vital signs reviewed and stable Respiratory status: spontaneous breathing, nonlabored ventilation and respiratory function stable Cardiovascular status: stable Postop Assessment: no headache, no backache and epidural receding Anesthetic complications: no    Last Vitals:  Vitals:   08/16/17 1115 08/16/17 1130  BP: (!) 106/55 109/62  Pulse: 76 68  Resp: 18 18  Temp:      Last Pain:  Vitals:   08/16/17 1045  TempSrc:   PainSc: 0-No pain   Pain Goal:                 [employer]

## 2017-08-16 NOTE — Anesthesia Pain Management Evaluation Note (Signed)
  CRNA Pain Management Visit Note  Patient: Amber Underwood, 32 y.o., female  "Hello I am a member of the anesthesia team at [facility]. We have an anesthesia team available at all times to provide care throughout the hospital, including epidural management and anesthesia for C-section. I don't know your plan for the delivery whether it a natural birth, water birth, IV sedation, nitrous supplementation, doula or epidural, but we want to meet your pain goals."   1.Was your pain managed to your expectations on prior hospitalizations?   Yes   2.What is your expectation for pain management during this hospitalization?     Epidural  3.How can we help you reach that goal?" R>L  Side is numb, but RN turned patient to L side and patient stated that is seemed to help even out the block. Record the patient's initial score and the patient's pain goal.   Pain: 0  Pain Goal: 2 The [facility] wants you to be able to say your pain was always managed very well.  Jonathon Castelo 08/16/2017

## 2017-08-17 LAB — CBC
HEMATOCRIT: 32.1 % — AB (ref 36.0–46.0)
Hemoglobin: 10.8 g/dL — ABNORMAL LOW (ref 12.0–15.0)
MCH: 31.4 pg (ref 26.0–34.0)
MCHC: 33.6 g/dL (ref 30.0–36.0)
MCV: 93.3 fL (ref 78.0–100.0)
Platelets: 172 10*3/uL (ref 150–400)
RBC: 3.44 MIL/uL — ABNORMAL LOW (ref 3.87–5.11)
RDW: 12.6 % (ref 11.5–15.5)
WBC: 10.6 10*3/uL — AB (ref 4.0–10.5)

## 2017-08-17 MED ORDER — RHO D IMMUNE GLOBULIN 1500 UNIT/2ML IJ SOSY
300.0000 ug | PREFILLED_SYRINGE | Freq: Once | INTRAMUSCULAR | Status: AC
  Administered 2017-08-17: 300 ug via INTRAMUSCULAR
  Filled 2017-08-17: qty 2

## 2017-08-17 NOTE — Progress Notes (Signed)
Patient wishes to be discharged home today.  She is meeting all postpartum goals.   Mitchel Honour, DO

## 2017-08-17 NOTE — Discharge Instructions (Signed)
Call MD for T>100.4, heavy vaginal bleeding, severe abdominal pain, or respiratory distress.  Call office to schedule postpartum visit in 6 weeks.  Pelvic rest x 6 weeks.   

## 2017-08-17 NOTE — Lactation Note (Signed)
This note was copied from a baby's chart. Lactation Consultation Note  Patient Name: Amber Underwood [id number] Date: 08/17/2017 Reason for consult: Initial assessment;Term  57 hours old FT female who is being exclusively BF by his mother, she's a P2 and experienced BF, she was able to BF her first child for 12 months. Baby was swaddled in bassinet when entering the room, he was awake and cueing, offered assistance with latch and when mother said "yes" baby had a large emesis; LC picked baby up while dad changed bassinet bedding. Mom voiced baby has been very spitty since birth, LC had to burp baby prior feeding and he burped again.  Mom put baby STS to the right breast in football position and baby was able to latch right away, mom was very pleased, she said it's the first time baby latches this easy for this long. Multiple swallows were heard, baby was still nursing when exiting the room. Discussed newborn sleeping cycle and cluster feeding. Mom has been able to hand express very easily, she already has lots of colostrum.  Encouraged mom to feed baby STS 8-12 times/24 hours or sooner if feeding cues are present. Reviewed BF brochure, BF resources and feeding diary, parents are aware of LC services and will call PRN.  Maternal Data Formula Feeding for Exclusion: No Has patient been taught Hand Expression?: Yes Does the patient have breastfeeding experience prior to this delivery?: Yes  Feeding Feeding Type: Breast Fed Length of feed: 12 min(Baby still nursing when exiting the room)  LATCH Score Latch: Grasps breast easily, tongue down, lips flanged, rhythmical sucking.  Audible Swallowing: Spontaneous and intermittent  Type of Nipple: Everted at rest and after stimulation  Comfort (Breast/Nipple): Soft / non-tender  Hold (Positioning): Assistance needed to correctly position infant at breast and maintain latch.  LATCH Score: 9  Interventions Interventions: Breast feeding basics  reviewed;Assisted with latch;Skin to skin;Breast massage;Breast compression;Adjust position;Support pillows;Position options  Lactation Tools Discussed/Used WIC Program: No   Consult Status Consult Status: Follow-up Date: 08/18/17 Follow-up type: In-patient    Amoni Morales Venetia Constable 08/17/2017, 3:40 AM

## 2017-08-17 NOTE — Discharge Summary (Signed)
Obstetric Discharge Summary Reason for Admission: onset of labor Prenatal Procedures: none Intrapartum Procedures: spontaneous vaginal delivery Postpartum Procedures: none Complications-Operative and Postpartum: 2nd degree perineal laceration Hemoglobin  Date Value Ref Range Status  08/17/2017 10.8 (L) 12.0 - 15.0 g/dL Final   HCT  Date Value Ref Range Status  08/17/2017 32.1 (L) 36.0 - 46.0 % Final    Physical Exam:  General: alert, cooperative and appears stated age 32: appropriate Uterine Fundus: firm Incision: healing well, no significant drainage, no dehiscence DVT Evaluation: No evidence of DVT seen on physical exam. Negative Homan's sign. No cords or calf tenderness.  Discharge Diagnoses: Term Pregnancy-delivered  Discharge Information: Date: 08/17/2017 Activity: pelvic rest Diet: routine Medications: PNV and Ibuprofen Condition: stable Instructions: refer to practice specific booklet Discharge to: home   Newborn Data: Live born female  Birth Weight: 8 lb 3.8 oz (3735 g) APGAR: 8, 9  Newborn Delivery   Birth date/time:  08/16/2017 10:26:00 Delivery type:  Vaginal, Spontaneous     Home with mother.  Amber Underwood 08/17/2017, 9:48 AM

## 2017-08-17 NOTE — Progress Notes (Signed)
Post Partum Day 1 Subjective: no complaints, up ad lib, voiding and tolerating PO.  Desires circ.  Considering d/c home this pm.  Objective: Blood pressure 107/68, pulse 64, temperature 98 F (36.7 C), temperature source Oral, resp. rate 16, height  (1.575 m), weight 149 lb (67.6 kg), last menstrual period 10/23/2016, unknown if currently breastfeeding.  Physical Exam:  General: alert, cooperative and appears stated age Lochia: appropriate Uterine Fundus: firm Incision: healing well, no significant drainage, no dehiscence DVT Evaluation: No evidence of DVT seen on physical exam. Negative Homan's sign. No cords or calf tenderness.  Recent Labs    08/16/17 0422 08/17/17 0545  HGB 13.3 10.8*  HCT 39.5 32.1*    Assessment/Plan: Plan for discharge tomorrow, Breastfeeding and Circumcision prior to discharge  Circ-patient counseled re: risk of bleeding, infection, scarring.  All questions were answered.   LOS: 1 day   Algernon Mundie 08/17/2017, 8:56 AM

## 2017-08-17 NOTE — Lactation Note (Signed)
This note was copied from a baby's chart. Lactation Consultation Note  Patient Name: Amber Underwood [id number] Date: 08/17/2017 Reason for consult: Follow-up assessment  Visited with P2 Mom of 29 hr old term baby.  Baby 5.8% weight loss.  Baby has multiple stools first 24 hrs.   Baby on the breast in cross cradle hold.  Baby looked well latched, but he backed off breast, and nipple pinched a little. Baby wrapped in thick blanket. Repositioned Mom and added one more pillow for support.  Assisted Mom to use a U hold on her breast and baby latched deeply onto areola.  Multiple swallows identified.  Mom denies any discomfort.  Encouraged Mom to do alternate breast compression to increase milk transfer.   Encouraged STS, and feeding baby often on cue.  Due to sleepy periods in first 24 hrs, reassured Mom that baby would most likely cluster feed this evening/night.  Encouraged Mom to ask for assistance prn.  LATCH Score Latch: Grasps breast easily, tongue down, lips flanged, rhythmical sucking.  Audible Swallowing: A few with stimulation  Type of Nipple: Everted at rest and after stimulation  Comfort (Breast/Nipple): Soft / non-tender  Hold (Positioning): No assistance needed to correctly position infant at breast.  LATCH Score: 9  Interventions Interventions: Breast feeding basics reviewed;Assisted with latch;Skin to skin;Breast massage;Hand express;Expressed milk;Support pillows;Position options;Adjust position;Breast compression  Consult Status Consult Status: Follow-up Date: 08/18/17 Follow-up type: In-patient    Judee Clara  RN IBCLC 08/17/2017, 4:01 PM

## 2017-08-18 LAB — RH IG WORKUP (INCLUDES ABO/RH)
ABO/RH(D): B NEG
FETAL SCREEN: NEGATIVE
Gestational Age(Wks): 41
UNIT DIVISION: 0

## 2017-08-19 ENCOUNTER — Ambulatory Visit: Payer: Self-pay

## 2017-08-19 NOTE — Lactation Note (Signed)
This note was copied from a baby's chart. Lactation Consultation Note  Patient Name: Amber Underwood [id number] Date: 08/19/2017 Reason for consult: Follow-up assessment;Infant weight loss;Term  48 hours old female who is being exclusively BF by his mother, she's a P2. RN called for lactation assistance due to baby's weight loss, he's at 9%. Baby has been having large emesis episodes nearly after every time he feeds, per mom they have occurred in between feedings. When asked mom, she described the emesis as yellow liquid consistency sometimes (looks like colostrum) but other times looks like yellow cottage cheese.   Baby is most likely losing weight due to ongoing/recurrent emesis, explained to mom that even though baby is spitting up we need to start supplementing with some extra calories to offset the weight loss. Mom voiced that she has a good milk supply, she hears  Swallows at the breast and that she even had to request a manual pump to get some "relief" due to her abundant milk supply. Advised mom to start using her own milk to supplement baby; offered to set her up with a DEBP but she wishes to wait for baby to get weight in the morning before deciding if she wants to pump bilaterally with her own personal Medela Pump and Style; she has it at home and will bring it to the hospital if needed.  Encouraged mom to keep feeding baby 8-12 times/24 hours or sooner if feeding cues are present. She'll also feed baby any EBM she has available when she uses her hand pump. Mom will try to keep baby in an upright position after feedings for at least 30 minutes. She's aware of LC services and will call PRN.  Maternal Data    Feeding Feeding Type: Breast Fed Length of feed: 5 min  Interventions Interventions: Breast feeding basics reviewed;Hand pump  Lactation Tools Discussed/Used Tools: Pump Breast pump type: Manual Pump Review: Setup, frequency, and cleaning Initiated by:: RN Date initiated::  08/18/17   Consult Status Consult Status: Follow-up Date: 08/18/17 Follow-up type: In-patient    [name] 08/19/2017, 3:45 AM

## 2018-01-04 ENCOUNTER — Other Ambulatory Visit: Payer: Self-pay | Admitting: Radiology

## 2018-01-04 DIAGNOSIS — N6320 Unspecified lump in the left breast, unspecified quadrant: Secondary | ICD-10-CM

## 2018-01-09 ENCOUNTER — Ambulatory Visit
Admission: RE | Admit: 2018-01-09 | Discharge: 2018-01-09 | Disposition: A | Discharge status: Home / Self Care | Payer: BLUE CROSS/BLUE SHIELD | Source: Ambulatory Visit | Attending: Radiology | Admitting: Radiology

## 2018-01-09 ENCOUNTER — Ambulatory Visit
Admission: RE | Admit: 2018-01-09 | Discharge: 2018-01-09 | Disposition: A | Discharge status: Home / Self Care | Payer: Managed Care, Other (non HMO) | Source: Ambulatory Visit | Attending: Radiology | Admitting: Radiology

## 2018-01-09 DIAGNOSIS — N6320 Unspecified lump in the left breast, unspecified quadrant: Secondary | ICD-10-CM

## 2018-12-17 IMAGING — US US MFM FETAL NUCHAL TRANSLUCENCY
1 series · 15 of 28 positions shown · non-contrast
Comparison: none

[Series 1: us mfm fetal nuchal translucency · 45 acquisitions, 15 frames shown]
[im 1/45]
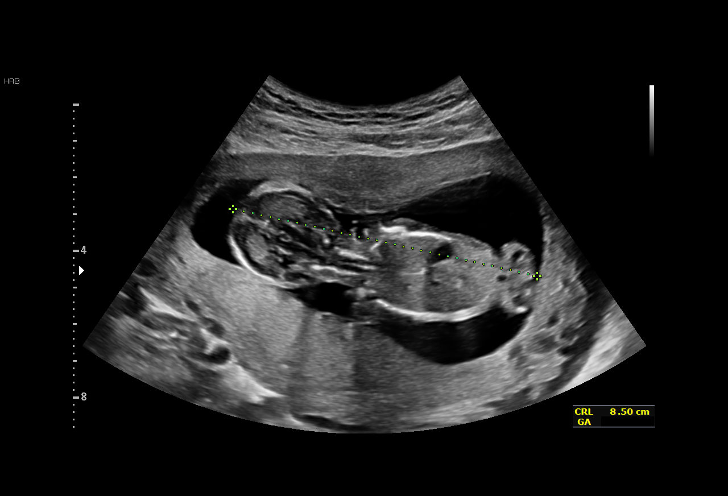
[im 4/45]
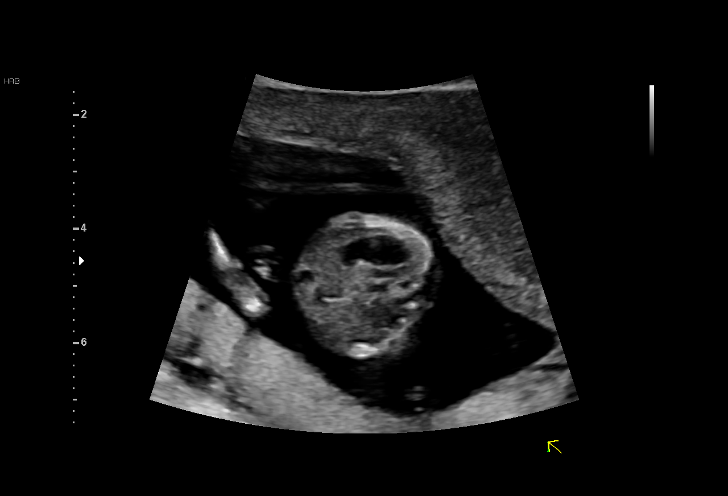
[im 7/45]
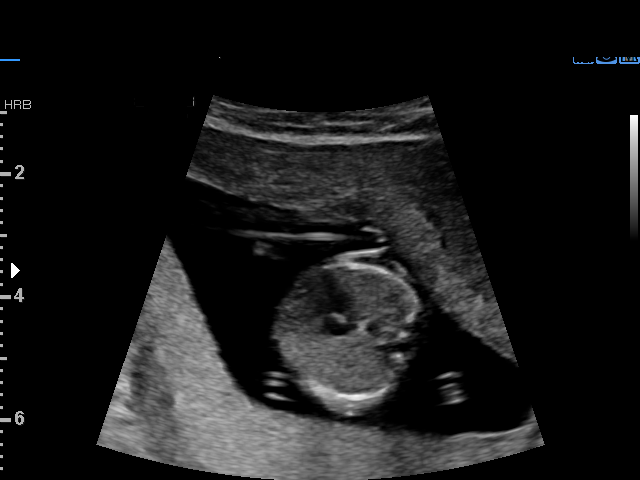
[im 10/45]
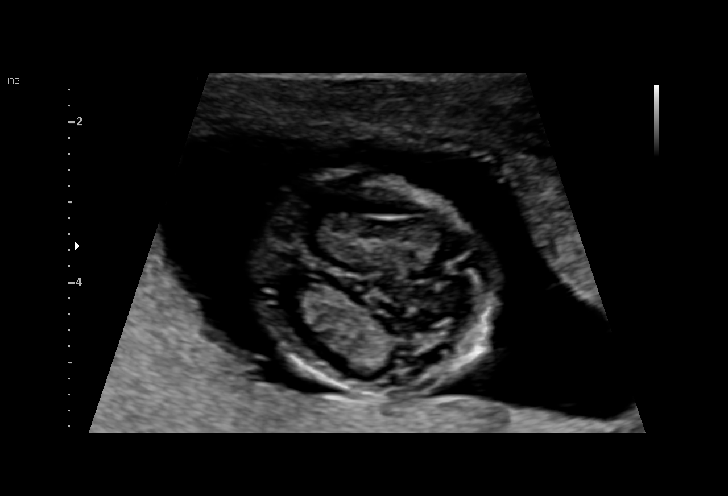
[im 14/45]
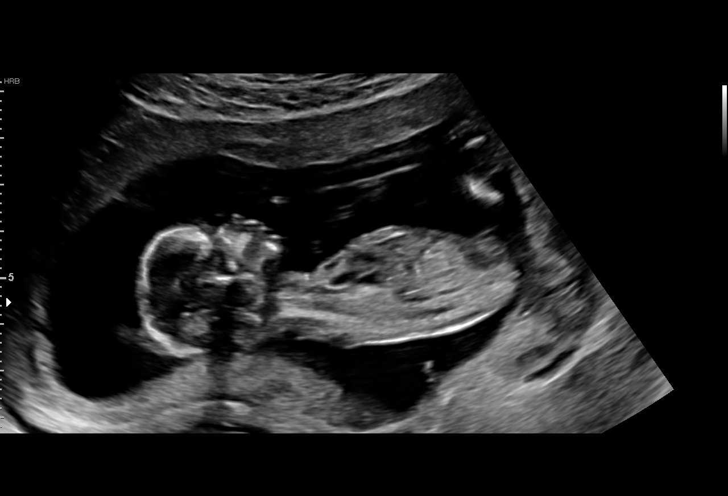
[im 17/45]
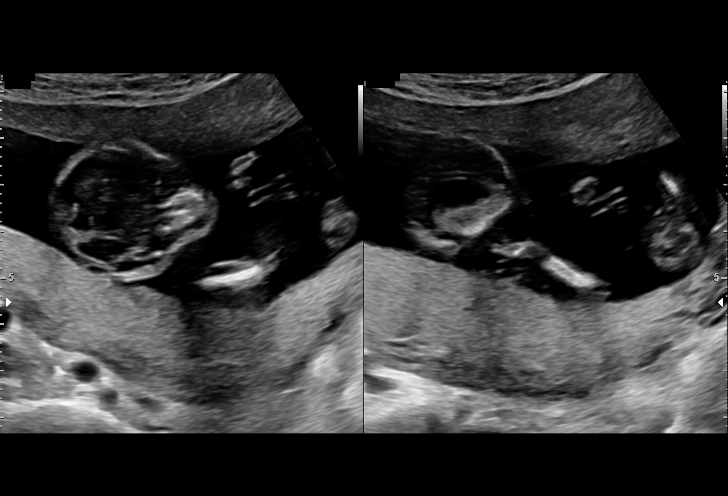
[im 20/45]
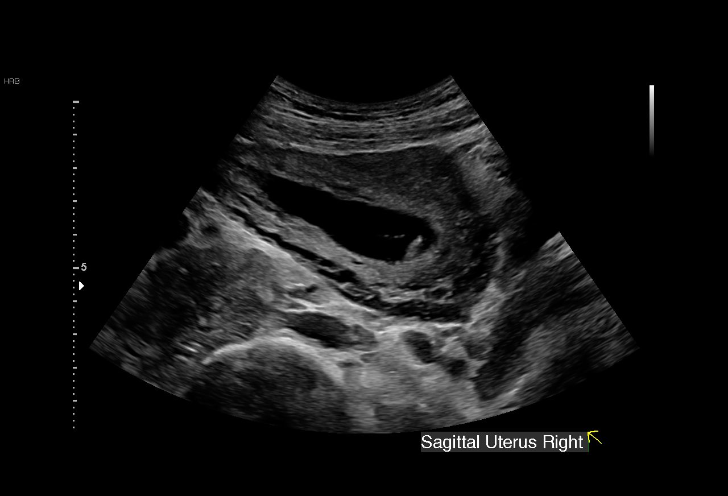
[im 23/45]
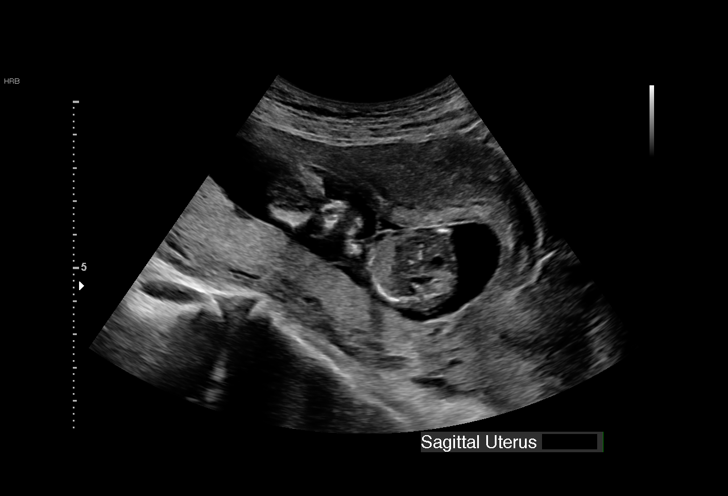
[im 25/45]
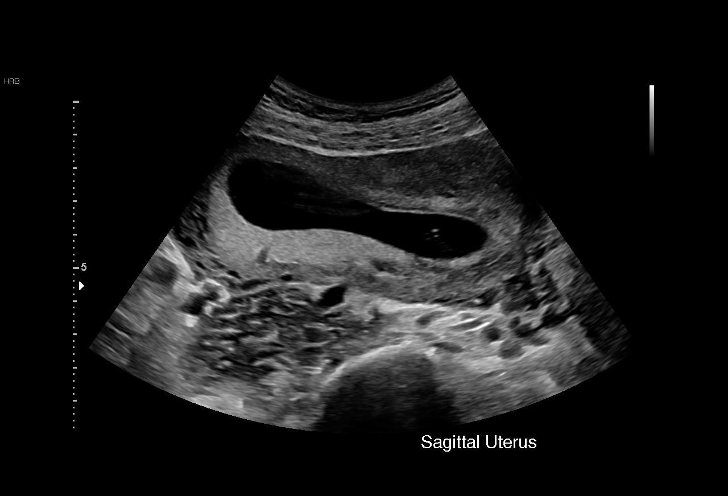
[im 28/45]
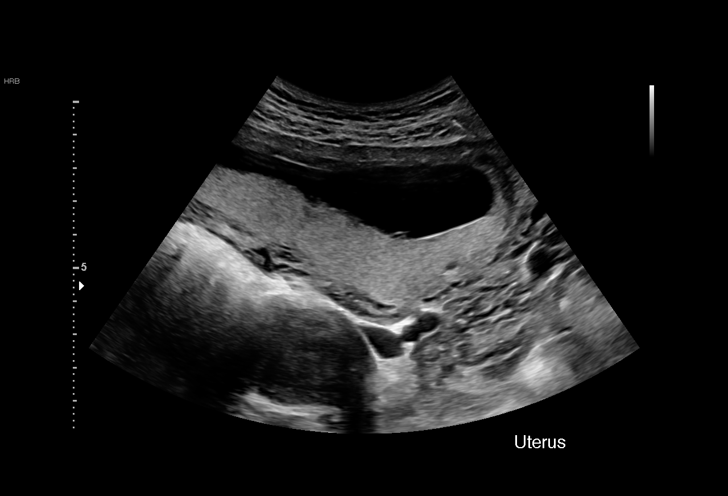
[im 31/45]
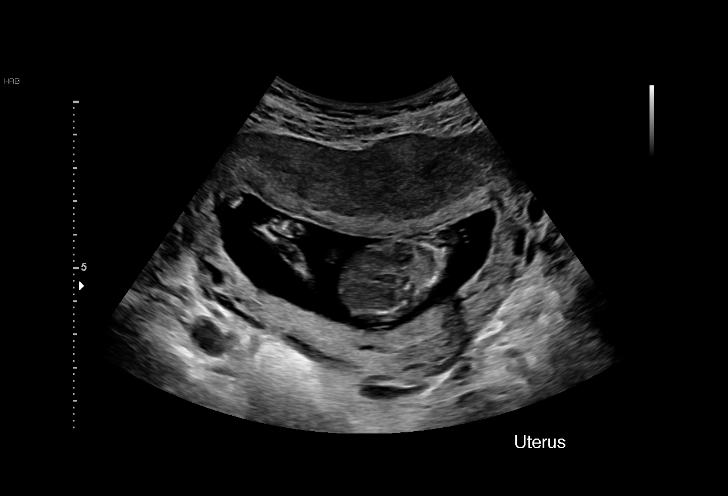
[im 35/45]
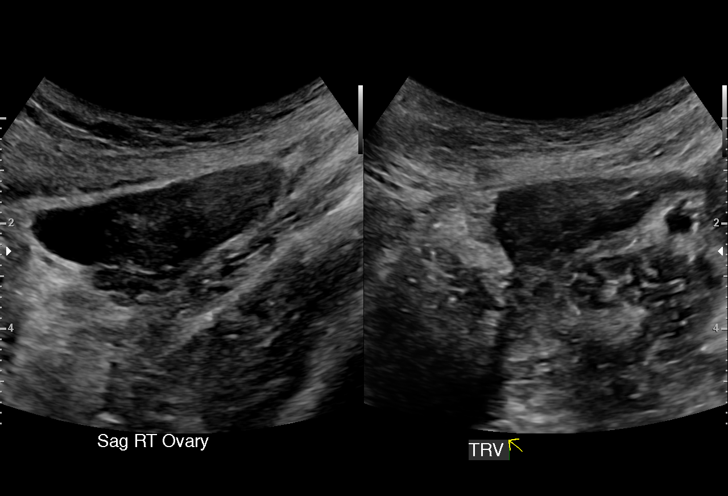
[im 38/45]
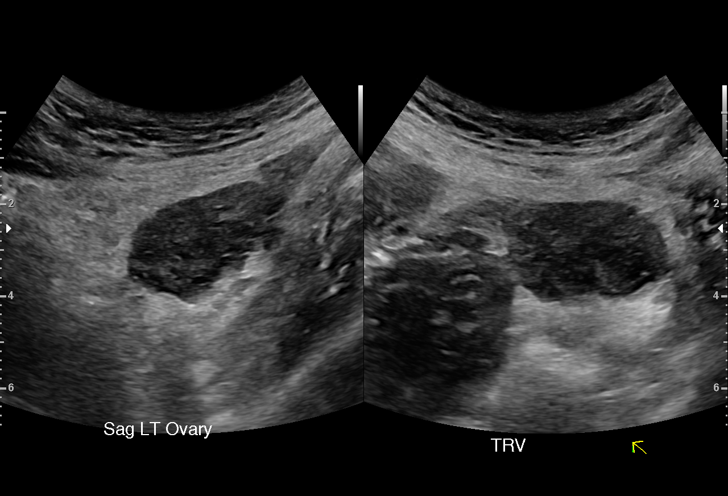
[im 41/45]
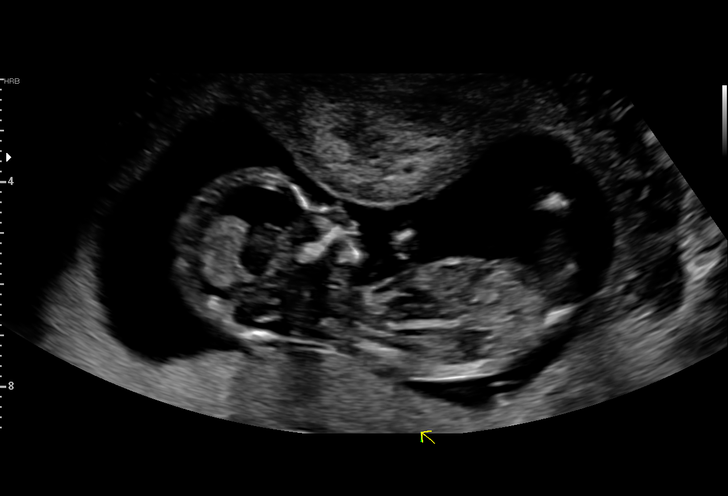
[im 45/45]
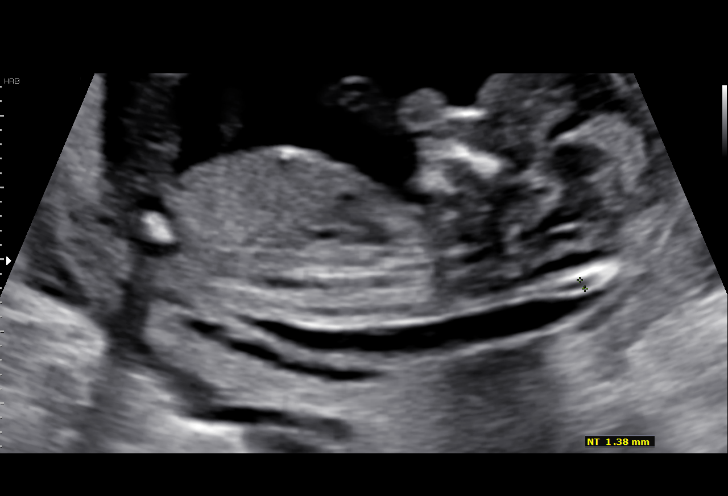

[15 of 28 positions shown; findings below may reference images not displayed]

TRANSLUCENCY

1  MONA-LIZA MA            [id number]      [id number]     [id number]
Indications

13 weeks gestation of pregnancy
Encounter for nuchal translucency

OB History

Blood Type:            Height:  5'2"   Weight (lb):  120       BMI:
Gravidity:    2         Term:   1        Prem:   1
Living:       1
Fetal Evaluation

Num Of Fetuses:     1
Preg. Location:     13 weeks
Cardiac Activity:   Observed
Biometry

CRL:      73.5  mm     G. Age:  13w 1d                  EDD:   08/13/17

NT:        1.5  mm
Gestational Age

LMP:           15w 1d        Date:  10/23/16                 EDD:   07/30/17
Best:          13w 5d     Det. By:  Early Ultrasound         EDD:   08/09/17
(01/04/17)
1st Trimester Genetic Sonogram Screening

CRL:            73.5  mm    G. Age:   13w 1d                 EDD:   08/13/17
Nuc Trans:       1.5  mm
Nasal Bone:                 Present
Comments

The patient presented for nuchal translucency screen as part
of a first trimester screen.  Blood drawn for serum analytes
today.
Impression

Single living intrauterine pregnancy at 13 weeks 5 days.
CRL consistent with EDC based on early ultrasound.
Pregnancy should be dated by early ultrasound
No gross fetal anomalies identified.
Nasal bone present.
Normal nuchal translucency
Recommendations

Recommend follow-up ultrasound examination in 4-5 weeks
for detailed fetal anatomic survey.

## 2020-01-13 ENCOUNTER — Encounter (INDEPENDENT_AMBULATORY_CARE_PROVIDER_SITE_OTHER): Payer: Self-pay | Admitting: Internal Medicine

## 2020-01-13 ENCOUNTER — Ambulatory Visit (INDEPENDENT_AMBULATORY_CARE_PROVIDER_SITE_OTHER): Payer: BC Managed Care – PPO | Admitting: Internal Medicine

## 2020-01-13 VITALS — BP 110/71 | HR 59 | Temp 98.6°F | Resp 16 | Ht 62.0 in | Wt 126.4 lb

## 2020-01-13 DIAGNOSIS — Z23 Encounter for immunization: Secondary | ICD-10-CM

## 2020-01-13 DIAGNOSIS — R002 Palpitations: Secondary | ICD-10-CM

## 2020-01-13 DIAGNOSIS — Z0000 Encounter for general adult medical examination without abnormal findings: Secondary | ICD-10-CM

## 2020-01-13 LAB — LIPID PANEL
Cholesterol / HDL Ratio: 2.6
Cholesterol: 164 mg/dL (ref 0–199)
HDL: 63 mg/dL (ref 40–9999)
LDL Calculated: 86 mg/dL (ref 0–99)
Triglycerides: 73 mg/dL (ref 34–149)
VLDL Calculated: 15 mg/dL (ref 10–40)

## 2020-01-13 LAB — COMPREHENSIVE METABOLIC PANEL
ALT: 12 U/L (ref 0–55)
AST (SGOT): 18 U/L (ref 5–34)
Albumin/Globulin Ratio: 1.6 (ref 0.9–2.2)
Albumin: 4.3 g/dL (ref 3.5–5.0)
Alkaline Phosphatase: 47 U/L (ref 37–117)
Anion Gap: 10 (ref 5.0–15.0)
BUN: 6 mg/dL — ABNORMAL LOW (ref 7.0–19.0)
Bilirubin, Total: 1.2 mg/dL (ref 0.2–1.2)
CO2: 26 mEq/L (ref 21–29)
Calcium: 9.6 mg/dL (ref 8.5–10.5)
Chloride: 102 mEq/L (ref 100–111)
Creatinine: 0.8 mg/dL (ref 0.4–1.5)
Globulin: 2.7 g/dL (ref 2.0–3.7)
Glucose: 91 mg/dL (ref 70–100)
Potassium: 4.5 mEq/L (ref 3.5–5.1)
Protein, Total: 7 g/dL (ref 6.0–8.3)
Sodium: 138 mEq/L (ref 136–145)

## 2020-01-13 LAB — GFR: EGFR: >60

## 2020-01-13 LAB — HEMOLYSIS INDEX: Hemolysis Index: 6 (ref 0–18)

## 2020-01-13 LAB — HEMOGLOBIN A1C
Average Estimated Glucose: 82.5 mg/dL
Hemoglobin A1C: 4.5 % — ABNORMAL LOW (ref 4.6–5.9)

## 2020-01-13 NOTE — Progress Notes (Signed)
THIS NOTE HAS BEEN WRITTEN BY A MEDICAL STUDENT. THIS NOTE SHOULD NOT BE USED FOR MEDICAL DECISION MAKING OR BILLING.    Subjective:      Date: 01/13/2020 9:02 AM   Patient ID: [name] [name] is a 34 y.o. female.    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 Has had mammo last Oct for "lumpy and bumpy" texture, showed only dense breast texture  General Health Risks: family history of prostate cancer, no family history of colon cancer and family history of breast cancer; dad with prostate cancer, PGM & P aunt with breast cancer  Safety Elements Used: uses seat belts, smoke detectors in household, sunscreen use, does not text and drive and no guns at home     [name] [name] is a 34yo woman with no PMH who reports to establish care & for her annual physical.  Reports increased anxiety over the last several weeks with intermittent associated fluttering palpitations and chest pressure. Denies dyspnea, lightheadedness or syncope; does not feel like a panic attack to her. Her sxs occur both at rest and with exertion.  No PMH of cardiopulmonary disease, father died of an MI this year.  Psychosocial stressors include the death of her father and her 2 young children (4yo & 2yo) returning  to in person daycare.  She's not interested in pursuing treatment for her anxiety at this point, is working on managing herself at home.     Problem List:  There is no problem list on file for this patient.      Current Medications:  No current outpatient medications on file.     No current facility-administered medications for this visit.       Allergies:  No Known Allergies    Past Medical History:  Past Medical History:   Diagnosis Date    No pertinent past medical history        Past Surgical History:  Past Surgical History:   Procedure Laterality Date    no past surgical history         Family History:  Family History   Problem Relation Age of Onset    Hypertension Mother     Heart attack Father     Prostate cancer Father     Breast cancer Paternal Grandmother        Social History:  Social History     Socioeconomic History    Marital status: Not on file     Spouse name: Not on file    Number of children: Not on file    Years of education: Not on file    Highest education level: Not on file   Occupational History    Not on file   Tobacco Use    Smoking  status: Never Smoker    Smokeless tobacco: Never Used   Vaping Use    Vaping Use: Never used   Substance and Sexual Activity    Alcohol use: Yes     Comment: socially    Drug use: Never    Sexual activity: Yes     Partners: Male     Birth control/protection: None   Other Topics Concern    Not on file   Social History Narrative    Not on file     Social Determinants of Health     Financial Resource Strain: Low Risk     Difficulty of Paying Living Expenses: Not hard at all   Food Insecurity: No Food Insecurity    Worried About Programme researcher, broadcasting/film/video in the Last Year: Never true    Ran Out of Food in the Last Year: Never true   Transportation Needs: No Transportation Needs    Lack of Transportation (Medical): No    Lack of Transportation (Non-Medical): No   Physical Activity: Insufficiently Active    Days of Exercise per Week: 4 days     Minutes of Exercise per Session: 30 min   Stress: No Stress Concern Present    Feeling of Stress : Only a little   Social Connections: Socially Isolated    Frequency of Communication with Friends and Family: Once a week    Frequency of Social Gatherings with Friends and Family: Once a week    Attends Religious Services: Never    Database administrator or Organizations: No    Attends Engineer, structural: Never    Marital Status: Married   Catering manager Violence: Not At Risk    Fear of Current or Ex-Partner: No    Emotionally Abused: No    Physically Abused: No    Sexually Abused: No       The following sections were reviewed this encounter by the provider:   Tobacco   Allergies   Meds   Problems   Med Hx   Surg Hx   Fam Hx          ROS:   General/Constitutional:   Denies Change in appetite. Denies Chills. Denies Fatigue. Denies Fever.   Ophthalmologic:   Denies Blurred vision. Denies Eye Discharge. Denies Eye Pain.   ENT:   Denies Nasal Discharge. Denies Hoarseness. Denies Ear pain. Denies Nosebleed. Denies Sinus pain. Denies Sore throat.   Endocrine:   Denies Polydipsia. Denies Polyuria. Denies Weakness.   Respiratory:   Denies Paroxysmal Nocturnal Dyspnea. Denies Cough. Denies Orthopnea. Denies Shortness of breath. Denies Wheezing.   Cardiovascular:   Reports Palpitations & Chest Pressure. Denies Leg Claudication. Denies Swelling in hands/feet.   Gastrointestinal:   Denies Abdominal pain. Denies Blood in stool. Denies Constipation. Denies Diarrhea. Denies Heartburn. Denies Nausea. Denies Vomiting.   Hematology:   Denies Easy bruising. Denies Easy Bleeding. Denies Swollen glands.   Genitourinary:   Denies Blood in urine. Denies Nocturia. Denies Painful urination.   Musculoskeletal:   Denies Joint pain. Denies Joint stiffness. Denies Leg cramps. Denies Muscle aches. Denies Weakness in LE. Denies Swollen joints. Denies Weakness in UE.   Skin:   Denies Itching.Denies Rash.   Neurologic:   Denies  Balance difficulty. Denies Dizziness. Denies Gait abnormality. Denies Headache. Denies Pre-Syncope. Denies Memory loss. Denies Seizures. Denies Tingling/Numbness.   Psychiatric:   Reports Anxiety. Denies Depressed mood. Denies Difficulty sleeping.     Objective:     Vitals:  BP 110/71 (BP Site: Right arm, Patient Position: Sitting, Cuff Size: Medium)    Pulse (!) 59    Temp 98.6 F (37 C) (Oral)    Resp 16    Ht 1.575 m (5\' 2" )    Wt 57.3 kg (126 lb 6.4 oz)    LMP 12/20/2019 (Exact Date)    SpO2 99%    BMI 23.12 kg/m     Examination:   General Examination:   GENERAL APPEARANCE: alert, in no acute distress, well developed, well nourished, oriented to time, place, and person.   HEAD: normal appearance, atraumatic.   EYES: extraocular movement intact (EOMI), pupils equal, round, reactive to light and accommodation, sclera anicteric, conjunctiva clear.   EARS: tympanic membranes normal bilaterally, external canals normal .   ORAL CAVITY: normal oropharynx, normal lips, mucosa moist, no lesions.   THROAT: normal appearance, clear, no erythema.   NECK/THYROID: neck supple,  no cervical lymphadenopathy, no jugular venous distention, no thyromegaly. 0.5x0.5cm bony prominence over L lateral hyoid  LYMPH NODES: no palpable adenopathy.   SKIN: good turgor, no rashes, no suspicious lesions.   HEART: S1, S2 normal, no murmurs, rubs, gallops, regular rate and rhythm.   LUNGS: normal effort / no distress, normal breath sounds, clear to auscultation bilaterally, no wheezes, rales, rhonchi.   BREASTS: deferred, pt is established with GYN  ABDOMEN: bowel sounds present, no hepatosplenomegaly, soft, nontender, nondistended.   RECTAL: deferred  FEMALE GENITOURINARY: deferred, pt is established with GYN  MUSCULOSKELETAL: full range of motion, no swelling or deformity.   EXTREMITIES: no edema, no clubbing, cyanosis, or edema.   PERIPHERAL PULSES: 2+ dorsalis pedis, 2+ posterior tibial.   NEUROLOGIC: nonfocal, cranial nerves 2-12  grossly intact, deep tendon reflexes 2+ symmetrical, normal strength, tone and reflexes, sensory exam intact.   PSYCH: cognitive function intact, mood/affect full range, speech clear.     Assessment/Plan:       1. Well adult exam  - CBC WITH MANUAL DIFFERENTIAL  - Comprehensive metabolic panel  - Lipid panel  - TSH, Abn Reflex to Free T4, Serum  - Hemoglobin A1C    2. Flu vaccine need  - Flu vaccine QUADRIVALENT (PF) 6 months and older (FLULAVAL/FLUARIX)    3. Palpitations  - CBC WITH MANUAL DIFFERENTIAL  - TSH, Abn Reflex to Free T4, Serum  - ECG 12 lead    Health Maintenance:  Recommend optimizing low carbohydrate diet efforts and obtaining at least 150 minutes of aerobic exercise per week. Recommend 20-25 grams of dietary fiber daily. Recommend drinking at least 60-80 ounces of water per day. Recommend optimizing low sodium diet measures ( less than 2 grams of sodium in the diet per day ).Vision screening UTD. Dental Screening UTD. Cervical cancer screening is UTD. Gynecologist surveillance is UTD.    RTC in 1 year for annual physical and as needed.    Lenward Chancellor, MS3  Medical Student  [employer]  January 13, 2020    THIS NOTE HAS BEEN WRITTEN BY A MEDICAL STUDENT. THIS NOTE SHOULD NOT BE USED FOR MEDICAL DECISION MAKING OR BILLING.

## 2020-01-13 NOTE — Progress Notes (Signed)
Subjective:      Date: 01/13/2020 9:56 AM   Patient ID: [name] [name] is a 34 y.o. female.    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 Has had mammo last Oct for "lumpy and bumpy" texture, showed only dense breast texture  General Health Risks: family history of prostate cancer, no family history of colon cancer and family history of breast cancer; dad with prostate cancer, PGM & P aunt with breast cancer  Safety Elements Used: uses seat belts, smoke detectors in household, sunscreen use, does not text and drive and no guns at home   Has had palpitations and chest pressure fort he past couple of weeks.  States in the past week she has not had any of the palpitations or chest pain.  Also increased anxiety, with father passing away and young kids retuning to in person daycare  Also admits to coffee daily.  She denies dizziness and no shortness of breath.  Problem List:  There is no problem list on file for this patient.      Current Medications:  No current outpatient medications on file.     No current facility-administered medications for this visit.       Allergies:  No Known Allergies    Past Medical History:  Past Medical History:   Diagnosis Date    No pertinent past medical  history        Past Surgical History:  Past Surgical History:   Procedure Laterality Date    no past surgical history         Family History:  Family History   Problem Relation Age of Onset    Hypertension Mother     Heart attack Father     Prostate cancer Father     Breast cancer Paternal Grandmother        Social History:  Social History     Socioeconomic History    Marital status: Married     Spouse name: Not on file    Number of children: Not on file    Years of education: Not on file    Highest education level: Not on file   Occupational History    Not on file   Tobacco Use    Smoking status: Never Smoker    Smokeless tobacco: Never Used   Vaping Use    Vaping Use: Never used   Substance and Sexual Activity    Alcohol use: Yes     Comment: socially    Drug use: Never    Sexual activity: Yes     Partners: Male     Birth control/protection: None   Other Topics Concern    Not on file   Social History Narrative    Not on  file     Social Determinants of Health     Financial Resource Strain: Low Risk     Difficulty of Paying Living Expenses: Not hard at all   Food Insecurity: No Food Insecurity    Worried About Programme researcher, broadcasting/film/video in the Last Year: Never true    Barista in the Last Year: Never true   Transportation Needs: No Transportation Needs    Lack of Transportation (Medical): No    Lack of Transportation (Non-Medical): No   Physical Activity: Insufficiently Active    Days of Exercise per Week: 4 days    Minutes of Exercise per Session: 30 min   Stress: No Stress Concern Present    Feeling of Stress : Only a little   Social Connections: Socially Isolated    Frequency of Communication with Friends and Family: Once a week    Frequency of Social Gatherings with Friends and Family: Once a week    Attends Religious Services: Never    Database administrator or Organizations: No    Attends Engineer, structural: Never    Marital Status: Married   Catering manager  Violence: Not At Risk    Fear of Current or Ex-Partner: No    Emotionally Abused: No    Physically Abused: No    Sexually Abused: No       The following sections were reviewed this encounter by the provider:   Tobacco   Allergies   Meds   Problems   Med Hx   Surg Hx   Fam Hx          ROS:   General/Constitutional:   Denies Change in appetite. Denies Chills. Denies Fatigue. Denies Fever.   Ophthalmologic:   Denies Blurred vision. Denies Eye Discharge. Denies Eye Pain.   ENT:   Denies Nasal Discharge. Denies Hoarseness. Denies Ear pain. Denies Nosebleed. Denies Sinus pain. Denies Sore throat.   Endocrine:   Denies Polydipsia. Denies Polyuria. Denies Weakness.   Respiratory:   Denies Paroxysmal Nocturnal Dyspnea. Denies Cough. Denies Orthopnea. Denies Shortness of breath. Denies Wheezing.   Cardiovascular:   Denies Chest pain. Denies Chest pain with exertion. Denies Leg Claudication. Denies Palpitations. Denies Swelling in hands/feet.   Gastrointestinal:   Denies Abdominal pain. Denies Blood in stool. Denies Constipation. Denies Diarrhea. Denies Heartburn. Denies Nausea. Denies Vomiting.   Hematology:   Denies Easy bruising. Denies Easy Bleeding. Denies Swollen glands.   Genitourinary:   Denies Blood in urine. Denies Nocturia. Denies Painful urination.   Musculoskeletal:   Denies Joint pain. Denies Joint stiffness. Denies Leg cramps. Denies Muscle aches. Denies Weakness in LE. Denies Swollen joints. Denies Weakness in UE.   Skin:   Denies Itching.Denies Rash.   Neurologic:   Denies Balance difficulty. Denies Dizziness. Denies Gait abnormality. Denies Headache. Denies Pre-Syncope. Denies Memory loss. Denies Seizures. Denies Tingling/Numbness.   Psychiatric:   Denies Anxiety. Denies Depressed mood. Denies Difficulty sleeping.     Objective:     Vitals:  BP 110/71 (BP Site: Right arm, Patient Position: Sitting, Cuff Size: Medium)    Pulse (!) 59    Temp 98.6 F (37 C) (Oral)    Resp 16    Ht 1.575 m (5\' 2" )    Wt  57.3 kg (126 lb 6.4 oz)    LMP 12/20/2019 (Exact Date)    SpO2 99%    BMI 23.12 kg/m     Examination:   General Examination:   GENERAL  APPEARANCE: alert, in no acute distress, well developed, well nourished, oriented to time, place, and person.   HEAD: normal appearance, atraumatic.   EYES: extraocular movement intact (EOMI), pupils equal, round, reactive to light and accommodation, sclera anicteric, conjunctiva clear.   EARS: tympanic membranes normal bilaterally, external canals normal .   NOSE: normal nasal mucosa, no lesions.   ORAL CAVITY: normal oropharynx, normal lips, mucosa moist, no lesions.   THROAT: normal appearance, clear, no erythema.   NECK/THYROID: neck supple, no carotid bruit, carotid pulse 2+ bilaterally, no cervical lymphadenopathy, no neck mass palpated, no jugular venous distention, no thyromegaly.  Prominent hyoid, no masses.  LYMPH NODES: no palpable adenopathy.   SKIN: good turgor, no rashes, no suspicious lesions.   HEART: S1, S2 normal, no murmurs, rubs, gallops, regular rate and rhythm.   LUNGS: normal effort / no distress, normal breath sounds, clear to auscultation bilaterally, no wheezes, rales, rhonchi.   BREASTS: no discharge, no masses palpable bilaterally, nontender.   ABDOMEN: bowel sounds present, no hepatosplenomegaly, soft, nontender, nondistended.   FEMALE GENITOURINARY: Deferred, up-to-date  MUSCULOSKELETAL: full range of motion, no swelling or deformity.   EXTREMITIES: no edema, no clubbing, cyanosis, or edema.   PERIPHERAL PULSES: 2+ dorsalis pedis, 2+ posterior tibial.   NEUROLOGIC: nonfocal, cranial nerves 2-12 grossly intact, deep tendon reflexes 2+ symmetrical, normal strength, tone and reflexes, sensory exam intact.   PSYCH: cognitive function intact, mood/affect full range, speech clear.     Assessment/Plan:       1. Well adult exam  - CBC WITH MANUAL DIFFERENTIAL  - Comprehensive metabolic panel  - Lipid panel  - TSH, Abn Reflex to Free T4, Serum  - Hemoglobin  A1C    2. Flu vaccine need  - Flu vaccine QUADRIVALENT (PF) 6 months and older (FLULAVAL/FLUARIX)    3. Palpitations  - CBC WITH MANUAL DIFFERENTIAL  - TSH, Abn Reflex to Free T4, Serum  - ECG 12 lead  -EKG reviewed with patient, normal sinus rhythm no acute ischemic changes  -Reassurance and recommended avoiding coffee, she agrees to plan  Health Maintenance:  Immunizations UTD. Vision screening UTD. Dental Screening UTD. Cervical cancer screening is UTD.    Return in about 1 year (around 01/12/2021) for Annual Physical and as needed.    Kela Millin, MD

## 2020-01-13 NOTE — Progress Notes (Signed)
Have you seen any specialists/other providers since your last visit with us?    New patient    Arm preference verified?   Yes    The patient is due for nothing at this time, HM is up-to-date.

## 2020-01-13 NOTE — Progress Notes (Signed)
Verified by Jeweliana Dudgeon, MA

## 2020-01-14 LAB — CBC WITH MANUAL DIFFERENTIAL
Absolute NRBC: 0 10*3/uL (ref 0.00–0.00)
Atypical Lymphocytes %: 3 %
Atypical Lymphocytes Absolute: 0.19 10*3/uL — ABNORMAL HIGH (ref 0.00–0.00)
Band Neutrophils Absolute: 0.13 10*3/uL (ref 0.00–1.00)
Band Neutrophils: 2 %
Basophils Absolute Manual: 0.13 10*3/uL — ABNORMAL HIGH (ref 0.00–0.08)
Basophils Manual: 2 %
Cell Morphology: NORMAL
Eosinophils Absolute Manual: 0.06 10*3/uL (ref 0.00–0.44)
Eosinophils Manual: 1 %
Hematocrit: 42.3 % (ref 34.7–43.7)
Hgb: 14.3 g/dL (ref 11.4–14.8)
Lymphocytes Absolute Manual: 1.9 10*3/uL (ref 0.42–3.22)
Lymphocytes Manual: 30 %
MCH: 31.2 pg (ref 25.1–33.5)
MCHC: 33.8 g/dL (ref 31.5–35.8)
MCV: 92.4 fL (ref 78.0–96.0)
MPV: 10.5 fL (ref 8.9–12.5)
Monocytes Absolute: 0.19 10*3/uL — ABNORMAL LOW (ref 0.21–0.85)
Monocytes Manual: 3 %
Neutrophils Absolute Manual: 3.73 10*3/uL (ref 1.10–6.33)
Nucleated RBC: 0 /100 WBC (ref 0.0–0.0)
Platelet Estimate: NORMAL
Platelets: 252 10*3/uL (ref 142–346)
RBC: 4.58 10*6/uL (ref 3.90–5.10)
RDW: 11 % (ref 11–15)
Segmented Neutrophils: 59 %
WBC: 6.32 10*3/uL (ref 3.10–9.50)

## 2020-01-14 LAB — THYROID STIMULATING HORMONE (TSH), REFLEX ON ABNORMAL TO FREE T4, SERUM: TSH, Abn Reflex to Free T4, Serum: 1.42 u[IU]/mL (ref 0.35–4.94)

## 2020-06-08 HISTORY — PX: D & C: SHX3657

## 2020-06-17 ENCOUNTER — Other Ambulatory Visit: Payer: Self-pay | Admitting: Women's Health

## 2020-06-17 DIAGNOSIS — O09521 Supervision of elderly multigravida, first trimester: Secondary | ICD-10-CM

## 2020-07-13 ENCOUNTER — Ambulatory Visit: Payer: BC Managed Care – PPO

## 2020-07-27 ENCOUNTER — Ambulatory Visit (INDEPENDENT_AMBULATORY_CARE_PROVIDER_SITE_OTHER): Payer: Self-pay | Admitting: Internal Medicine

## 2020-08-10 ENCOUNTER — Encounter (INDEPENDENT_AMBULATORY_CARE_PROVIDER_SITE_OTHER): Payer: Self-pay | Admitting: Internal Medicine

## 2020-08-10 ENCOUNTER — Ambulatory Visit (INDEPENDENT_AMBULATORY_CARE_PROVIDER_SITE_OTHER): Payer: BC Managed Care – PPO | Admitting: Internal Medicine

## 2020-08-10 VITALS — BP 112/73 | HR 71 | Temp 98.5°F

## 2020-08-10 DIAGNOSIS — R599 Enlarged lymph nodes, unspecified: Secondary | ICD-10-CM

## 2020-08-10 DIAGNOSIS — L989 Disorder of the skin and subcutaneous tissue, unspecified: Secondary | ICD-10-CM

## 2020-08-10 NOTE — Progress Notes (Unsigned)
Have you seen any specialists/other providers since your last visit with us?    Yes    Arm preference verified?   Yes    The patient is due for nothing at this time, HM is up-to-date.

## 2020-08-12 NOTE — Progress Notes (Signed)
Subjective:      Date: 08/10/2020 7:03 AM   Patient ID: [name] [name] is a 35 y.o. female.    Chief Complaint:  Chief Complaint   Patient presents with    Skin Problem       HPI:  HPI  Patient is a 35 year old female presenting with complaints of a spot on her left thigh area for the past year or so, and she thinks it has increased in size.  Also reports spot on her left temple area.  + Family history of skin cancer  Also she reports has had a swollen gland under her chin area for the past week or so.  She denies fever and no URI/cold symptoms.    Problem List:  There is no problem list on file for this patient.      Current Medications:  No outpatient medications have been marked as taking for the 08/10/20 encounter (Office Visit) with Kela Millin, MD.       Allergies:  No Known Allergies    Past Medical History:  Past Medical History:   Diagnosis Date    No pertinent past medical history        Past Surgical History:  Past Surgical History:   Procedure Laterality Date    D & C  06/2020    no past surgical history         Family History:  Family History   Problem Relation Age of Onset    Hypertension Mother     Heart attack Father     Prostate cancer Father     Breast cancer Paternal Grandmother        Social History:  Social History     Socioeconomic History    Marital status: Married   Tobacco Use    Smoking status: Never Smoker    Smokeless tobacco: Never Used   Haematologist Use: Never used   Substance and Sexual Activity    Alcohol use: Yes     Alcohol/week: 4.0 - 5.0 standard drinks     Types: 4 - 5 Glasses of wine per week    Drug use: Never    Sexual activity: Yes     Partners: Male     Birth control/protection: None     Social Determinants of Health     Financial Resource Strain: Low Risk     Difficulty of Paying Living Expenses: Not hard at all   Food Insecurity: No Food Insecurity    Worried About Programme researcher, broadcasting/film/video in the Last Year: Never true    Ran Out of Food in the Last  Year: Never true   Transportation Needs: No Transportation Needs    Lack of Transportation (Medical): No    Lack of Transportation (Non-Medical): No   Physical Activity: Insufficiently Active    Days of Exercise per Week: 4 days    Minutes of Exercise per Session: 30 min   Stress: No Stress Concern Present    Feeling of Stress : Only a little   Social Connections: Socially Isolated    Frequency of Communication with Friends and Family: Once a week    Frequency of Social Gatherings with Friends and Family: Once a week    Attends Religious Services: Never    Database administrator or Organizations: No    Attends Banker Meetings: Never    Marital Status: Married   Catering manager Violence: Not At Risk  Fear of Current or Ex-Partner: No    Emotionally Abused: No    Physically Abused: No    Sexually Abused: No   Housing Stability: Low Risk     Unable to Pay for Housing in the Last Year: No    Number of Places Lived in the Last Year: 2    Unstable Housing in the Last Year: No        The following sections were reviewed this encounter by the provider:   Tobacco   Allergies   Meds   Problems   Med Hx   Surg Hx   Fam Hx            ROS:  General/Constitutional:   Denies Chills. Denies Fatigue. Denies Fever.   Ophthalmologic:   Denies Blurred vision. Denies Eye Pain.   ENT:   Denies Nasal Discharge. Denies Ear pain. Denies Sinus pain.   Endocrine:   Denies Polydipsia. Denies Polyuria.   Respiratory:   Denies Cough. Denies Orthopnea. Denies Shortness of breath. Denies Wheezing.   Cardiovascular:   Denies Chest pain. Denies Chest pain with exertion. . Denies Swelling in hands/feet.   Gastrointestinal:   Denies Abdominal pain. Denies Blood in stool. Denies Constipation. Denies Diarrhea. Denies Heartburn. Denies Nausea. Denies Vomiting.   Genitourinary:   . Denies Frequent urination. Denies Painful urination.   Musculoskeletal:   Denies Leg cramps. Denies Muscle aches.   Skin:    As  above.  Neurologic:   Denies Dizziness. Denies Gait abnormality. Denies Headache. Denies Tingling/Numbness.     Objective:   Vitals:  BP 112/73 (BP Site: Right arm, Cuff Size: Medium)    Pulse 71    Temp 98.5 F (36.9 C) (Temporal)    LMP 08/03/2020 (Approximate)       Physical Exam:  General Examination:   Physical Exam   GENERAL APPEARANCE: alert, in no acute distress, well developed, well nourished  oriented to time, place, and person.    HEENT: Left temple area with subcentimeter papule  NECK/THYROID: neck supple, small anterior cervical/submandibular mass/ enlarged node.  Nontender  LYMPH: no cervical/supraclavicular adenopathy   HEART: S1, S2 normal, no murmurs, rubs, gallops, regular rate and rhythm.   LUNGS: normal effort / no distress, normal breath sounds, clear to auscultation  bilaterally, no wheezes, rales, rhonchi.   ABDOMEN:soft, +BS, NT/ND, no masses palpable  EXTREMITIES: No LE edema bilaterally.  Left upper thigh posterior laterally with about 0.5 cm slightly raised pink lesion  PERIPHERAL PULSES: 2+ dorsalis pedis, 2+ posterior tibial bilaterally.   NEURO:CN2-12 grossly intact, nonfocal  PSYCH: alert and oriented to time,place and person.   SKIN: moist, no focal rash    Assessment:       1. Skin lesion of left leg  -And of face as above  - Ambulatory referral to Dermatology    2. Adenopathy, submandibular  - Ultrasound head neck soft tissue; Future>> if not resolved in the next couple of weeks as discussed        Plan:   As above  Return if symptoms worsen or fail to improve.        Kela Millin, MD

## 2020-11-23 LAB — GONOCOCCUS CULTURE
Chlamydia trachomatis Culture: NEGATIVE
Culture Gonorrhoeae: NEGATIVE

## 2020-12-21 LAB — HEPATITIS B SURFACE ANTIGEN W/ REFLEX TO CONFIRMATION: Hepatitis B Surface Antigen: NEGATIVE

## 2020-12-21 LAB — RUBELLA ANTIBODY, IGG: Rubella AB, IgG: IMMUNE

## 2020-12-21 LAB — HIV AG/AB 4TH GENERATION: HIV Ag/Ab, 4th Generation: NONREACTIVE

## 2020-12-21 LAB — SYPHILIS SCREEN IGG AND IGM: Syphilis Screen IgG and IgM: NONREACTIVE

## 2021-04-10 NOTE — L&D Delivery Note (Signed)
NSVD Note    Date Time: 07/14/21 4:04 AM  Patient Name: [name] [name],[name] [name]  CNM: Golda Acre, CNM    Date of Delivery:   07/14/2021     Providers Performing:   Golda Acre, CNM    Procedure:     Spontaneous Vaginal Delivery    Anesthesia:     Epidural    Notes:      Patient presented to labor and delivery for induction for AMA, PD and fetal echogenic bowel noted on 40 week sono    Patient was GBS Negative      Membranes:     Rupture date: 07/14/2021   Rupture time: 12:09 AM   Rupture type: Spontaneous   Fluid Color: Clear       The patient progressed to complete dilation and effacement with the fetal vertex at the Station: -1. She then pushed to deliver a viable female  infant weighing    with APGARS of  8 , 9  at 1 and 5 minutes respectively.      Delivery was via spontaneous vaginal delivery.  The infant's head and anterior shoulder delivered without incident and the remainder of the body followed without complication.  The infant was bulb suctioned and cord clamped/cut as the infant was placed on the mother's abdomen. The placenta delivered spontaneously and intact with a 3-vessel cord.  The genital tract was carefully inspected and Intact. A small periurethral laceration was bleeding and was repaired with 4.0 vicryl with a figure 8 stitch and bleeding ceased.     After the repair, the perineum was intact and the external anal sphincter demonstrated good tone. Hemostasis was achieved with fundal massage and dilute IV oxytocin. Both mother and infant are stable in the immediate post-partum period. All sharp and sponge counts were correct at the conclusion of the delivery and repair.      Baby boy!    Estimated Blood Loss:   300 mL    Complications:     Condition: stable      Signed by: Golda Acre, CNM  07/14/2021 4:04 AM

## 2021-06-16 LAB — GROUP B STREP TRANSCRIBED: GBS Transcribed: NEGATIVE

## 2021-07-13 ENCOUNTER — Inpatient Hospital Stay
Admission: AD | Admit: 2021-07-13 | Discharge: 2021-07-15 | DRG: 807 | Disposition: A | Discharge status: Home / Self Care | Payer: BC Managed Care – PPO | Source: Ambulatory Visit | Attending: Obstetrics & Gynecology | Admitting: Obstetrics & Gynecology

## 2021-07-13 ENCOUNTER — Encounter: Payer: Self-pay | Admitting: Registered Nurse

## 2021-07-13 DIAGNOSIS — O480 Post-term pregnancy: Secondary | ICD-10-CM | POA: Diagnosis present

## 2021-07-13 DIAGNOSIS — O35DXX0 Maternal care for other (suspected) fetal abnormality and damage, fetal gastrointestinal anomalies, not applicable or unspecified: Secondary | ICD-10-CM | POA: Diagnosis present

## 2021-07-13 DIAGNOSIS — Z87440 Personal history of urinary (tract) infections: Secondary | ICD-10-CM

## 2021-07-13 DIAGNOSIS — Z3A40 40 weeks gestation of pregnancy: Secondary | ICD-10-CM

## 2021-07-13 LAB — TYPE AND SCREEN
AB Screen Gel: POSITIVE
ABO Rh: B NEG

## 2021-07-13 LAB — IHS 2ND ABORH REQUEST

## 2021-07-13 LAB — CBC AND DIFFERENTIAL
Absolute NRBC: 0 10*3/uL (ref 0.00–0.00)
Basophils Absolute Automated: 0.04 10*3/uL (ref 0.00–0.08)
Basophils Automated: 0.4 %
Eosinophils Absolute Automated: 0.02 10*3/uL (ref 0.00–0.44)
Eosinophils Automated: 0.2 %
Hematocrit: 36.5 % (ref 34.7–43.7)
Hgb: 12.4 g/dL (ref 11.4–14.8)
Immature Granulocytes Absolute: 0.07 10*3/uL (ref 0.00–0.07)
Immature Granulocytes: 0.7 %
Instrument Absolute Neutrophil Count: 7.2 10*3/uL — ABNORMAL HIGH (ref 1.10–6.33)
Lymphocytes Absolute Automated: 2.11 10*3/uL (ref 0.42–3.22)
Lymphocytes Automated: 21.7 %
MCH: 29.7 pg (ref 25.1–33.5)
MCHC: 34 g/dL (ref 31.5–35.8)
MCV: 87.5 fL (ref 78.0–96.0)
MPV: 9.9 fL (ref 8.9–12.5)
Monocytes Absolute Automated: 0.28 10*3/uL (ref 0.21–0.85)
Monocytes: 2.9 %
Neutrophils Absolute: 7.2 10*3/uL — ABNORMAL HIGH (ref 1.10–6.33)
Neutrophils: 74.1 %
Nucleated RBC: 0 /100 WBC (ref 0.0–0.0)
Platelets: 211 10*3/uL (ref 142–346)
RBC: 4.17 10*6/uL (ref 3.90–5.10)
RDW: 13 % (ref 11–15)
WBC: 9.72 10*3/uL — ABNORMAL HIGH (ref 3.10–9.50)

## 2021-07-13 LAB — ABO/RH: ABO Rh: B NEG

## 2021-07-13 LAB — ANTIBODY IDENTIFICATION - REFLEX: Antibody ID: POSITIVE

## 2021-07-13 MED ORDER — ONDANSETRON HCL 4 MG/2ML IJ SOLN
4.0000 mg | Freq: Three times a day (TID) | INTRAMUSCULAR | Status: DC | PRN
Start: 2021-07-13 — End: 2021-07-14

## 2021-07-13 MED ORDER — ALUM & MAG HYDROXIDE-SIMETH 200-200-20 MG/5ML PO SUSP
30.0000 mL | Freq: Four times a day (QID) | ORAL | Status: DC | PRN
Start: 2021-07-13 — End: 2021-07-14

## 2021-07-13 MED ORDER — SODIUM CHLORIDE 0.9 % IV SOLN
6.2500 mg | Freq: Four times a day (QID) | INTRAVENOUS | Status: DC | PRN
Start: 2021-07-13 — End: 2021-07-14

## 2021-07-13 MED ORDER — CALCIUM CARBONATE ANTACID 500 MG PO CHEW
1000.0000 mg | CHEWABLE_TABLET | Freq: Three times a day (TID) | ORAL | Status: DC | PRN
Start: 2021-07-13 — End: 2021-07-14

## 2021-07-13 MED ORDER — OXYTOCIN 10 UNIT/ML IJ SOLN
10.0000 [IU] | Freq: Once | INTRAMUSCULAR | Status: DC | PRN
Start: 2021-07-13 — End: 2021-07-14

## 2021-07-13 MED ORDER — CARBOPROST TROMETHAMINE 250 MCG/ML IM SOLN
250.0000 ug | Freq: Once | INTRAMUSCULAR | Status: DC | PRN
Start: 2021-07-13 — End: 2021-07-14

## 2021-07-13 MED ORDER — EPHEDRINE SULFATE 50 MG/ML IJ/IV SOLN (WRAP)
Status: AC
Start: 2021-07-13 — End: 2021-07-14
  Filled 2021-07-13: qty 1

## 2021-07-13 MED ORDER — ACETAMINOPHEN 325 MG PO TABS
650.0000 mg | ORAL_TABLET | ORAL | Status: DC | PRN
Start: 2021-07-13 — End: 2021-07-14

## 2021-07-13 MED ORDER — ACETAMINOPHEN 650 MG RE SUPP
650.0000 mg | RECTAL | Status: DC | PRN
Start: 2021-07-13 — End: 2021-07-14

## 2021-07-13 MED ORDER — PROMETHAZINE HCL 12.5 MG RE SUPP
12.5000 mg | Freq: Four times a day (QID) | RECTAL | Status: DC | PRN
Start: 2021-07-13 — End: 2021-07-14

## 2021-07-13 MED ORDER — OXYTOCIN-SODIUM CHLORIDE 30-0.9 UT/500ML-% IV SOLN
7.5000 [IU]/h | INTRAVENOUS | Status: DC | PRN
Start: 2021-07-13 — End: 2021-07-14

## 2021-07-13 MED ORDER — OXYTOCIN-SODIUM CHLORIDE 30-0.9 UT/500ML-% IV SOLN
7.5000 [IU]/h | INTRAVENOUS | Status: DC | PRN
Start: 2021-07-14 — End: 2021-07-14

## 2021-07-13 MED ORDER — MISOPROSTOL 200 MCG PO TABS
800.0000 ug | ORAL_TABLET | Freq: Once | ORAL | Status: DC | PRN
Start: 2021-07-13 — End: 2021-07-14

## 2021-07-13 MED ORDER — NALOXONE HCL 0.4 MG/ML IJ SOLN (WRAP)
0.2000 mg | INTRAMUSCULAR | Status: DC | PRN
Start: 2021-07-13 — End: 2021-07-14

## 2021-07-13 MED ORDER — FENTANYL-BUPIVACAINE-NACL 0.2-0.125-0.9 MG/100ML-% EP SOLN
EPIDURAL | Status: AC
Start: 2021-07-13 — End: 2021-07-14
  Filled 2021-07-13: qty 100

## 2021-07-13 MED ORDER — FENTANYL-BUPIVACAINE-NACL 0.2-0.125-0.9 MG/100ML-% EP SOLN
EPIDURAL | Status: DC
Start: 2021-07-14 — End: 2021-07-14
  Administered 2021-07-14: 10 mL/h via EPIDURAL
  Filled 2021-07-13: qty 100

## 2021-07-13 MED ORDER — TRANEXAMIC ACID-NACL 1000-0.7 MG/100ML-% IV SOLN
1000.0000 mg | Freq: Once | INTRAVENOUS | Status: DC | PRN
Start: 2021-07-13 — End: 2021-07-14

## 2021-07-13 MED ORDER — TERBUTALINE SULFATE 1 MG/ML IJ SOLN
0.2500 mg | Freq: Once | INTRAMUSCULAR | Status: DC | PRN
Start: 2021-07-13 — End: 2021-07-14

## 2021-07-13 MED ORDER — SOD CITRATE-CITRIC ACID 500-334 MG/5ML PO SOLN
30.0000 mL | Freq: Once | ORAL | Status: DC | PRN
Start: 2021-07-13 — End: 2021-07-14

## 2021-07-13 MED ORDER — SODIUM CHLORIDE 0.9 % IV SOLN
500.0000 mg | Freq: Once | INTRAVENOUS | Status: DC | PRN
Start: 2021-07-13 — End: 2021-07-14

## 2021-07-13 MED ORDER — PROMETHAZINE HCL 12.5 MG PO TABS
12.5000 mg | ORAL_TABLET | Freq: Four times a day (QID) | ORAL | Status: DC | PRN
Start: 2021-07-13 — End: 2021-07-14

## 2021-07-13 MED ORDER — METHYLERGONOVINE MALEATE 0.2 MG/ML IJ SOLN
200.0000 ug | INTRAMUSCULAR | Status: DC | PRN
Start: 2021-07-13 — End: 2021-07-14

## 2021-07-13 MED ORDER — OXYTOCIN-SODIUM CHLORIDE 30-0.9 UT/500ML-% IV SOLN
2.0000 m[IU]/min | INTRAVENOUS | Status: DC | PRN
Start: 2021-07-13 — End: 2021-07-14
  Administered 2021-07-13: 2 m[IU]/min via INTRAVENOUS
  Administered 2021-07-14: 999 m[IU]/min via INTRAVENOUS
  Filled 2021-07-13: qty 500

## 2021-07-13 MED ORDER — LIDOCAINE HCL (PF) 1 % IJ SOLN
10.0000 mL | INTRAMUSCULAR | Status: DC
Start: 2021-07-13 — End: 2021-07-14

## 2021-07-13 MED ORDER — ONDANSETRON 4 MG PO TBDP
4.0000 mg | ORAL_TABLET | Freq: Three times a day (TID) | ORAL | Status: DC | PRN
Start: 2021-07-13 — End: 2021-07-14

## 2021-07-13 MED ORDER — LACTATED RINGERS IV SOLN
INTRAVENOUS | Status: DC
Start: 2021-07-13 — End: 2021-07-14

## 2021-07-13 NOTE — H&P (Signed)
[facility] triage H&P    Date/Time: 04/05/233:07 PM  Room#: ACDU/ACDU-01      Name: [name] [name]  MRN: [id number]    Chief Complaint   Patient presents with    Non-stress Test       HPI:   [name] [name] is a 36 y.o. [id number] female with a gestation age of [redacted]w[redacted]d and Estimated Date of Delivery: 07/11/21 who presents to the hospital for NST following abnormal [place]. Routine in-office [place] today showed BPP 8/8, AFI 19, echogenic bowel, echogenic nature of fluid. Pt denies LOF/VB/CTX. Reports good fetal movement.    Her prenatal care is significant for AMA, UTI in pregnancy.     Pertinent items are noted in HPI.      OB History       Gravida   5    Para   2    Term        Preterm        AB   2    Living   2         SAB   1    IAB   1    Ectopic        Multiple        Live Births   2                 Past Medical History:   Diagnosis Date    No pertinent past medical history        Past Surgical History:   Procedure Laterality Date    D & C  06/2020    no past surgical history         No Known Allergies    Medications Prior to Admission   Medication Sig Dispense Refill Last Dose    Prenatal Vit-Fe Fumarate-FA (PRENATAL VITAMIN PO) Take by mouth   07/12/2021       Social History     Socioeconomic History    Marital status: Married   Tobacco Use    Smoking status: Never    Smokeless tobacco: Never   Vaping Use    Vaping status: Never Used   Substance and Sexual Activity    Alcohol use: Not Currently     Alcohol/week: 4.0 - 5.0 standard drinks of alcohol     Types: 4 - 5 Glasses of wine per week    Drug use: Never    Sexual activity: Yes     Partners: Male     Birth control/protection: None     Social Determinants of Health     Financial Resource Strain: Low Risk  (01/12/2020)    Overall Financial Resource Strain (CARDIA)     Difficulty of Paying Living Expenses: Not hard at all   Food Insecurity: No Food Insecurity (01/12/2020)    Hunger Vital Sign     Worried About Running Out of Food in the Last Year: Never true      Ran Out of Food in the Last Year: Never true   Transportation Needs: No Transportation Needs (01/12/2020)    PRAPARE - Therapist, art (Medical): No     Lack of Transportation (Non-Medical): No   Physical Activity: Insufficiently Active (01/12/2020)    Exercise Vital Sign     Days of Exercise per Week: 4 days     Minutes of Exercise per Session: 30 min   Stress: No Stress Concern Present (01/12/2020)    [employer] of  Occupational Health - Occupational Stress Questionnaire     Feeling of Stress : Only a little   Social Connections: Socially Isolated (01/12/2020)    Social Connection and Isolation Panel [NHANES]     Frequency of Communication with Friends and Family: Once a week     Frequency of Social Gatherings with Friends and Family: Once a week     Attends Religious Services: Never     Database administratorActive Member of Clubs or Organizations: No     Attends BankerClub or Organization Meetings: Never     Marital Status: Married   Catering managerntimate Partner Violence: Not At Risk (01/12/2020)    Humiliation, Afraid, Rape, and Kick questionnaire     Fear of Current or Ex-Partner: No     Emotionally Abused: No     Physically Abused: No     Sexually Abused: No       Family History   Problem Relation Age of Onset    Hypertension Mother     Heart attack Father     Prostate cancer Father     Breast cancer Paternal Grandmother          Vital Signs:     Temp:  [97.5 F (36.4 C)] 97.5 F (36.4 C)  Heart Rate:  [79-81] 81  Resp Rate:  [18] 18  BP: (118)/(82) 118/82    Physical Exam:   General appearance - alert, well appearing, and in no distress, oriented to person, place, and time, and normal appearing weight  Mental status - alert, oriented to person, place, and time, normal mood, behavior, speech, dress, motor activity, and thought processes  Pelvic - examination not indicated      Cervical Exam:    Not indicated     FHT: Baseline Rate: 140 BPM    Labs:     Results       Procedure Component Value Units Date/Time    ABO/Rh  [[id number]] Collected: 07/13/21 1452    Specimen: Blood Updated: 07/13/21 1503    2nd ABORH Request [[id number]] Collected: 07/13/21 1431     Updated: 07/13/21 1449     2nd ABORH Request FT    Narrative:      Pre-Surgical/Pre-Procedure->No  For transfusion?->No    Type and Screen [[id number]] Collected: 07/13/21 1431    Specimen: Blood Updated: 07/13/21 1434    Narrative:      Pre-Surgical/Pre-Procedure->No  For transfusion?->No    Lina SayreKleihauer Betke [[id number]] Collected: 07/13/21 1359    Specimen: Blood Updated: 07/13/21 1415            Assessment/Plan:     Assessment:  [id number] @40 .2w   AMA  Fetal echogenic bowel  Reactive NST with intermittent variables     Plan:   CEFM  KB per Renette ButtersKW, MD  Plan discussed with Renette ButtersKW, MD. Given US results and cat II tracing, IOL is recommended at this point. Pt agrees with this plan. There is currently no space or staffing to accommodate admission to L&D. Pt will remain in CDU with monitoring until staffing allows for admission.       [name] [name], CNM

## 2021-07-13 NOTE — Progress Notes (Signed)
Pt sent for NST  and KB from office due to appearance of amniotic fluid on ultrasound; pt vitals stable at this time and pt reports positive fetal movement. EFM and TOCO applied; midwife notified of pt's arrival.

## 2021-07-13 NOTE — Progress Notes (Signed)
Pt to be admitted; transferred to LDR 11, report given to susan, rn; care of pt released.

## 2021-07-13 NOTE — Progress Notes (Signed)
OB PROGRESS NOTE    Date Time: 07/13/21 7:28 PM  Patient Name: [name],[name]      Subjective:   [name] [name] is feeling well, now admitted to L&D. Discussed IOL process, what to expect. Patient is planning epidural.     Objective:     Vitals:    07/13/21 1737   BP: 116/70   Pulse: 81   Resp: 18   Temp: 97.5 F (36.4 C)   SpO2:        Cervical Exam: Dilation: 3, Effacement (%): 70, Station: -3, cephalic    Membrane Status: Intact,      FHT: Baseline Rate: 135 BPM, Variability: Moderate, Pattern: Accelerations    Contraction Frequency: 4-6           Assessment:   36 y.o. [id number] at [redacted]w[redacted]d  1. IOL for AMA/ echogenic fetal bowel, cervical ripening only at this point  2. Membranes intact  3. GBS: Negative  4.   Cat I  5. VSS    Plan:   Admit to L&D for IOL for AMA/ new [place] finding of echogenic fetal bowel   Admission labs ordered and to be collected   Saline-locked IV  CEFM   Start pitocin titration per unit protocol  Clear liquid diet  Encouraged frequent position change and PO hydration   Consider AROM with fetal descent   Reassess in 3-4h      Plan of care discussed with patient and husband, patient in agreement, questions answered and addressed.     Signed by: Arnette Norris, CNM 07/13/2021

## 2021-07-13 NOTE — Plan of Care (Signed)
Problem: Vaginal/Cesarean Delivery  Goal: Maternal Status within defined parameters  Outcome: Progressing  Goal: Evidence of Fetal Well Being  Outcome: Progressing  Goal: Free from Maternal/Fetal Infection  Outcome: Progressing  Goal: Intrapartum management of pain/discomfort  Outcome: Progressing  Goal: Postpartum management of pain/discomfort  Outcome: Progressing  Goal: Breasts are soft with nipple integrity intact  Outcome: Progressing  Goal: Gastrointestinal/Urinary management  Outcome: Progressing  Goal: Uterine management  Outcome: Progressing  Goal: Perineum will be clean, dry, and intact and without discharge or hematoma  Outcome: Progressing  Goal: VTE Prevention  Outcome: Progressing  Goal: Evidence of positive mother-baby interactions  Outcome: Progressing

## 2021-07-13 NOTE — Progress Notes (Signed)
Cnm holmes at bedside discussing POC with patient

## 2021-07-13 NOTE — Progress Notes (Signed)
Report given and care relinquished to [facility].  FOB at bedside.

## 2021-07-14 ENCOUNTER — Encounter: Payer: Self-pay | Admitting: Obstetrics & Gynecology

## 2021-07-14 LAB — RH IMM GLOBULIN

## 2021-07-14 LAB — KLEIHAUER BETKE: Kleihauer Betke Fetomaternal Hemorrhage: 0 mL

## 2021-07-14 LAB — FETAL BLEED SCREEN: Fetal Bleed Screen: NEGATIVE

## 2021-07-14 LAB — RHOGAM CANDIDATE

## 2021-07-14 MED ORDER — NIFEDIPINE 10 MG PO CAPS
20.0000 mg | ORAL_CAPSULE | Freq: Once | ORAL | Status: DC | PRN
Start: 2021-07-14 — End: 2021-07-15

## 2021-07-14 MED ORDER — IBUPROFEN 600 MG PO TABS
600.0000 mg | ORAL_TABLET | Freq: Once | ORAL | Status: AC | PRN
Start: 2021-07-14 — End: 2021-07-14
  Administered 2021-07-14: 600 mg via ORAL
  Filled 2021-07-14: qty 1

## 2021-07-14 MED ORDER — LACTATED RINGERS IV BOLUS
1000.0000 mL | Freq: Once | INTRAVENOUS | Status: DC
Start: 2021-07-14 — End: 2021-07-14

## 2021-07-14 MED ORDER — OXYCODONE HCL 5 MG PO TABS
5.0000 mg | ORAL_TABLET | Freq: Once | ORAL | Status: DC | PRN
Start: 2021-07-14 — End: 2021-07-14

## 2021-07-14 MED ORDER — FAMOTIDINE 10 MG/ML IV SOLN (WRAP)
20.0000 mg | Freq: Once | INTRAVENOUS | Status: DC | PRN
Start: 2021-07-14 — End: 2021-07-14

## 2021-07-14 MED ORDER — MEASLES, MUMPS & RUBELLA VAC IJ SOLR
0.5000 mL | INTRAMUSCULAR | Status: DC | PRN
Start: 2021-07-14 — End: 2021-07-15

## 2021-07-14 MED ORDER — NALOXONE HCL 0.4 MG/ML IJ SOLN (WRAP)
0.2000 mg | INTRAMUSCULAR | Status: DC | PRN
Start: 2021-07-14 — End: 2021-07-14

## 2021-07-14 MED ORDER — PROMETHAZINE HCL 12.5 MG PO TABS
12.5000 mg | ORAL_TABLET | Freq: Four times a day (QID) | ORAL | Status: DC | PRN
Start: 2021-07-14 — End: 2021-07-15

## 2021-07-14 MED ORDER — TETANUS-DIPHTH-ACELL PERTUSSIS 5-2.5-18.5 LF-MCG/0.5 IM SUSP/SUSY (WR)
0.5000 mL | INTRAMUSCULAR | Status: DC | PRN
Start: 2021-07-14 — End: 2021-07-15

## 2021-07-14 MED ORDER — PROMETHAZINE HCL 25 MG RE SUPP
12.5000 mg | Freq: Four times a day (QID) | RECTAL | Status: DC | PRN
Start: 2021-07-14 — End: 2021-07-15

## 2021-07-14 MED ORDER — IBUPROFEN 600 MG PO TABS
600.0000 mg | ORAL_TABLET | Freq: Four times a day (QID) | ORAL | Status: DC
Start: 2021-07-14 — End: 2021-07-15
  Administered 2021-07-14 – 2021-07-15 (×6): 600 mg via ORAL
  Filled 2021-07-14 (×6): qty 1

## 2021-07-14 MED ORDER — MISOPROSTOL 200 MCG PO TABS
800.0000 ug | ORAL_TABLET | Freq: Once | ORAL | Status: DC | PRN
Start: 2021-07-14 — End: 2021-07-15

## 2021-07-14 MED ORDER — NIFEDIPINE 10 MG PO CAPS
10.0000 mg | ORAL_CAPSULE | Freq: Once | ORAL | Status: DC | PRN
Start: 2021-07-14 — End: 2021-07-15

## 2021-07-14 MED ORDER — MISOPROSTOL 200 MCG PO TABS
800.0000 ug | ORAL_TABLET | Freq: Once | ORAL | Status: DC | PRN
Start: 2021-07-14 — End: 2021-07-14

## 2021-07-14 MED ORDER — SENNOSIDES-DOCUSATE SODIUM 8.6-50 MG PO TABS
1.0000 | ORAL_TABLET | Freq: Every evening | ORAL | Status: DC | PRN
Start: 2021-07-14 — End: 2021-07-15
  Administered 2021-07-14: 1 via ORAL
  Filled 2021-07-14: qty 1

## 2021-07-14 MED ORDER — NALOXONE HCL 0.4 MG/ML IJ SOLN (WRAP)
0.1000 mg | INTRAMUSCULAR | Status: DC | PRN
Start: 2021-07-14 — End: 2021-07-14

## 2021-07-14 MED ORDER — METOCLOPRAMIDE HCL 5 MG/ML IJ SOLN
10.0000 mg | Freq: Once | INTRAMUSCULAR | Status: DC | PRN
Start: 2021-07-14 — End: 2021-07-14

## 2021-07-14 MED ORDER — HYDROCORTISONE 1 % EX OINT
TOPICAL_OINTMENT | Freq: Three times a day (TID) | CUTANEOUS | Status: DC | PRN
Start: 2021-07-14 — End: 2021-07-15
  Filled 2021-07-14: qty 28.4

## 2021-07-14 MED ORDER — BUPIVACAINE HCL (PF) 0.25 % IJ SOLN
INTRAMUSCULAR | Status: DC | PRN
Start: 2021-07-14 — End: 2021-07-14
  Administered 2021-07-14: 6 mL via EPIDURAL

## 2021-07-14 MED ORDER — BISACODYL 10 MG RE SUPP
10.0000 mg | Freq: Every day | RECTAL | Status: DC | PRN
Start: 2021-07-14 — End: 2021-07-15

## 2021-07-14 MED ORDER — EPHEDRINE SULFATE 50 MG/ML IJ/IV SOLN (WRAP)
10.0000 mg | Freq: Once | Status: AC
Start: 2021-07-14 — End: 2021-07-14
  Administered 2021-07-14: 10 mg via INTRAVENOUS

## 2021-07-14 MED ORDER — METHYLERGONOVINE MALEATE 0.2 MG PO TABS
0.2000 mg | ORAL_TABLET | Freq: Four times a day (QID) | ORAL | Status: DC | PRN
Start: 2021-07-14 — End: 2021-07-15

## 2021-07-14 MED ORDER — LANOLIN EX OINT
TOPICAL_OINTMENT | CUTANEOUS | Status: DC | PRN
Start: 2021-07-14 — End: 2021-07-15

## 2021-07-14 MED ORDER — ONDANSETRON HCL 4 MG/2ML IJ SOLN
4.0000 mg | Freq: Three times a day (TID) | INTRAMUSCULAR | Status: DC | PRN
Start: 2021-07-14 — End: 2021-07-15

## 2021-07-14 MED ORDER — PRENATAL PLUS IRON 29-1 MG PO TABS
1.0000 | ORAL_TABLET | Freq: Every day | ORAL | Status: DC
Start: 2021-07-14 — End: 2021-07-15
  Administered 2021-07-14 – 2021-07-15 (×2): 1 via ORAL
  Filled 2021-07-14 (×2): qty 1

## 2021-07-14 MED ORDER — METHYLERGONOVINE MALEATE 0.2 MG/ML IJ SOLN
0.2000 mg | Freq: Four times a day (QID) | INTRAMUSCULAR | Status: DC | PRN
Start: 2021-07-14 — End: 2021-07-15

## 2021-07-14 MED ORDER — LIDOCAINE-EPINEPHRINE 1.5 %-1:200000 IJ SOLN
INTRAMUSCULAR | Status: DC | PRN
Start: 2021-07-13 — End: 2021-07-14
  Administered 2021-07-13: 2 mL via EPIDURAL

## 2021-07-14 MED ORDER — BENZOCAINE 20% +/- MENTHOL 0.5% EX AERO (WRAP)
1.0000 | INHALATION_SPRAY | CUTANEOUS | Status: DC | PRN
Start: 2021-07-14 — End: 2021-07-15
  Administered 2021-07-14: 1 via TOPICAL
  Filled 2021-07-14: qty 85

## 2021-07-14 MED ORDER — WITCH HAZEL EX PADS (WRAP)
1.0000 | MEDICATED_PAD | CUTANEOUS | Status: DC | PRN
Start: 2021-07-14 — End: 2021-07-15
  Administered 2021-07-14: 1 via TOPICAL

## 2021-07-14 MED ORDER — ACETAMINOPHEN 325 MG PO TABS
650.0000 mg | ORAL_TABLET | ORAL | Status: DC | PRN
Start: 2021-07-14 — End: 2021-07-15

## 2021-07-14 MED ORDER — NALOXONE HCL 0.4 MG/ML IJ SOLN (WRAP)
0.2000 mg | INTRAMUSCULAR | Status: DC | PRN
Start: 2021-07-14 — End: 2021-07-15

## 2021-07-14 MED ORDER — OXYCODONE HCL 5 MG PO TABS
5.0000 mg | ORAL_TABLET | ORAL | Status: DC | PRN
Start: 2021-07-14 — End: 2021-07-15

## 2021-07-14 MED ORDER — RHO D IMMUNE GLOBULIN 1500 UNITS IM SOSY
300.0000 ug | PREFILLED_SYRINGE | Freq: Once | INTRAMUSCULAR | Status: AC
Start: 2021-07-14 — End: 2021-07-14
  Administered 2021-07-14: 300 ug via INTRAMUSCULAR
  Filled 2021-07-14: qty 300

## 2021-07-14 MED ORDER — ACETAMINOPHEN 650 MG RE SUPP
650.0000 mg | RECTAL | Status: DC | PRN
Start: 2021-07-14 — End: 2021-07-15

## 2021-07-14 MED ORDER — SOD CITRATE-CITRIC ACID 500-334 MG/5ML PO SOLN
30.0000 mL | Freq: Once | ORAL | Status: DC | PRN
Start: 2021-07-14 — End: 2021-07-14

## 2021-07-14 MED ORDER — MAGNESIUM HYDROXIDE 400 MG/5ML PO SUSP
30.0000 mL | Freq: Four times a day (QID) | ORAL | Status: DC | PRN
Start: 2021-07-14 — End: 2021-07-15

## 2021-07-14 MED ORDER — SODIUM CHLORIDE 0.9 % IV SOLN
6.2500 mg | Freq: Four times a day (QID) | INTRAVENOUS | Status: DC | PRN
Start: 2021-07-14 — End: 2021-07-15

## 2021-07-14 MED ORDER — ONDANSETRON 4 MG PO TBDP
4.0000 mg | ORAL_TABLET | Freq: Three times a day (TID) | ORAL | Status: DC | PRN
Start: 2021-07-14 — End: 2021-07-15

## 2021-07-14 MED ORDER — METHYLERGONOVINE MALEATE 0.2 MG/ML IJ SOLN
0.2000 mg | INTRAMUSCULAR | Status: DC | PRN
Start: 2021-07-14 — End: 2021-07-14

## 2021-07-14 NOTE — Anesthesia Preprocedure Evaluation (Signed)
Anesthesia Evaluation    AIRWAY    Mallampati: II    TM distance: >3 FB  Neck ROM: full  Mouth Opening:full   CARDIOVASCULAR    regular and normal       DENTAL    no notable dental hx               PULMONARY         OTHER FINDINGS                                      Relevant Problems   No relevant active problems               Anesthesia Plan    ASA 2     epidural               (36 year old parturient requesting epidural.  Chart reviewed and patient examined. Risks discussed including but not limited to hypotension, bleeding, infection and soreness at epidural site.  Patient understands risks and wishes to proceed.  Signed consent on chart.  Plan is for lumbar epidural for labor pain.)      Detailed anesthesia plan: epidural        Post op pain management: per surgeon    informed consent obtained      pertinent labs reviewed             Signed by: Tacey Heap, MD 07/14/21 12:09 AM

## 2021-07-14 NOTE — Progress Notes (Signed)
OB PROGRESS NOTE    Date Time: 07/14/21 12:36 AM  Patient Name: [name],[name]      Subjective:   [name] [name] is now comfortable with her epidural. She is agreeable to an exam. Experienced SROM - clear fluid with vernix at 0015.     Objective:     Vitals:    07/14/21 0017   BP: 120/58   Pulse: 77   Resp:    Temp:    SpO2:        Cervical Exam: Dilation: 5, Effacement (%): 80, Station: -1, cephalic    Membrane Status: Spontaneous, Color: Clear    FHT: Baseline Rate: 135 BPM, Variability: Moderate, Pattern: Accelerations    Contraction Frequency: 2-3     Medication: Pitocin @ 4 mu/min    Pain meds: Epidural in place     GBS: Negative    Assessment:   36 y.o. [id number] at [redacted]w[redacted]d  1. IOL for PD, AMA, fetal echogenic bowel seen today on [place], early labor, progressing normally  2. ROM x 0 hours  3. FHR Category: Category I   4. VSS    Plan:     SROM, clear fluid  Continue IOL with IV pitocin, increase by 2 mu/min per protocol  EPCA for pain management  Maternal position change to facilitate labor progress  Anticipate a vaginal birth    Plan of care discussed with patient and husband, patient in agreement, questions answered and addressed.     Signed by: Golda Acre, CNM 07/14/2021        OB

## 2021-07-14 NOTE — Anesthesia Procedure Notes (Addendum)
Epidural    Patient location during procedure: L&D  Reason for block: Labor or C-section  Block at Surgeon's request: Yes    Start time: 07/13/2021 11:54 PM  End time: 07/13/2021 11:57 PM    Staffing  Performed: anesthesiologist   Anesthesiologist: Tacey Heap, MD  Performed by: Tacey Heap, MD  Authorized by: Tacey Heap, MD      Pre-procedure Checklist   Completed: patient identified, site marked, surgical consent, pre-op evaluation, timeout performed, risks and benefits discussed, monitors and equipment checked, anesthesia consent given and correct site      Epidural  Patient monitoring: Pulse oximetry, EKG and NIBP    Premedication: Meaningful Contact Maintained and No  Patient position: sitting    Anesthesia block local: 3 cc of 1.5% lidocaine with epi.  Dose: 3 mL    Attempts  Number of attempts: 1                    Successful attempt  Interspace: L3-4  Approach: midline    Needle type: Touhy needle   Needle gauge: 17  Injection technique: LOR air  Epidural Space ID: 4 cm  CSF Return: No   Blood Return: No  Paresthesia Pain: No    Needle Placement  Needle type: Touhy needle   Needle gauge: 17  Injection technique: LOR air  CSF Return: No  Blood Return: No          Paresthesia Pain: No    Catheter Placement   Catheter type: end hole  Catheter size: 19 G  Catheter at skin depth: 10 cm  CSF Return: No  Blood Return: No  Test Dose:negative (2 cc of 1.5% lidocaine with epi)    Incremental injection: yes  Injection made incrementally with aspirations every 3 mL.    No Catheter IV/SA Signs or Symptoms    Assessment   Sensory level: T10  Patient tolerated procedure well: Yes  Block Outcome: no complications, successful block and pain improved  Additional Notes  Negative heme and negative csf on aspiration.

## 2021-07-14 NOTE — Plan of Care (Signed)
Problem: Vaginal/Cesarean Delivery  Goal: Uterine management  Outcome: Progressing  Flowsheets (Taken 07/14/2021 2222)  Uterine management:   Assess fundus and notify LIP if not firm, midline, or at or below the umbilicus, or if abdomen is abnormally distended   Assess for hemorrhage risk using appropriate screening tool     Patient condition stable. Vital signs within normal limits. Fundus firm at u/1 with scant bleeding. Patient reminded to call nurse if she experiences heavy bleeding or clotting. Patient verbalizes understanding. Patient is ambulatory and independent with pericare. Plan of care discussed. Patient is in agreement with plan of care.

## 2021-07-14 NOTE — Plan of Care (Signed)
Problem: Vaginal/Cesarean Delivery  Goal: Maternal Status within defined parameters  Outcome: Progressing  Goal: Evidence of Fetal Well Being  Outcome: Progressing  Goal: Free from Maternal/Fetal Infection  Outcome: Progressing  Goal: Intrapartum management of pain/discomfort  Outcome: Progressing  Goal: Postpartum management of pain/discomfort  Outcome: Progressing  Goal: Breasts are soft with nipple integrity intact  Outcome: Progressing  Goal: Gastrointestinal/Urinary management  Outcome: Progressing  Goal: Uterine management  Outcome: Progressing  Goal: Perineum will be clean, dry, and intact and without discharge or hematoma  Outcome: Progressing  Goal: VTE Prevention  Outcome: Progressing  Goal: Evidence of positive mother-baby interactions  Outcome: Progressing

## 2021-07-14 NOTE — Nursing Progress Note (Signed)
Plan of care reviewed and pt verbalized understanding, pt is alert and oriented to postpartum unit, vitals and assessment are normal, fundus firm, midline, at U with small lochia observed, breast remain soft with nipple integrity intact, patient reports pain 0/10 with no c/o of dizziness, blurred vision or headaches, safety guidelines, lochia changes, feeding que, and breastfeeding on demand reviewed with patient, all questions and concerns answered, plan of care continues

## 2021-07-14 NOTE — Anesthesia Postprocedure Evaluation (Signed)
Anesthesia Post Evaluation    Patient: [name] [name]    * No procedures listed *    Anesthesia type: epidural    Last Vitals:   Vitals Value Taken Time   BP 127/76 07/14/21 0330   Temp 36.3 07/14/21 0423   Pulse 78 07/14/21 0330   Resp 16 07/14/21 0423   SpO2 99 % 07/14/21 0400               BP 127/76   Pulse 78   Temp 36.3 C (97.4 F) (Oral)   Resp 16   Ht 1.575 m (5\' 2" )   Wt 71.7 kg (158 lb)   LMP 08/03/2020 (Approximate)   SpO2 99%   BMI 28.90 kg/m   Anesthesia Post Evaluation:     Patient Evaluated: floor  Patient Participation: complete - patient participated  Level of Consciousness: awake and alert  Pain Score: 0  Pain Management: adequate  Multimodal analgesia pain management approach    Airway Patency: patent    Anesthetic complications: No      PONV Status: none    Cardiovascular status: acceptable  Respiratory status: acceptable  Hydration status: acceptable        Signed by: Tacey Heap, MD, 07/14/2021 4:23 AM

## 2021-07-14 NOTE — Progress Notes (Signed)
OB PROGRESS NOTE    Date Time: 07/14/21 3:13 AM  Patient Name: [name] [name]      Subjective:   [name] [name] is doing very well. She is comfortable with her epidural. Since SROM and exam around 0030 EFM has demonstrated recurrent variables with intermittent late decelerations. Overall variability has remained moderate with normal baseline. Although Cat II overall very reassuring.    Interventions included IVFB, maternal position change, ephedrine 10 mg IVP.     Objective:     Vitals:    07/14/21 0245   BP: 122/70   Pulse: 93   Resp:    Temp:    SpO2:        Cervical Exam: Dilation: Lip/rim (Comment), Effacement (%): 80, Station: -1, cephalic    Membrane Status: Spontaneous, Color: Clear    FHT: Baseline Rate: 140 BPM, Variability: Minimal, Pattern: Early decelerations, Variable decelerations, Late decelerations    Contraction Frequency: 2-3     Medication: Pitocin @ 2 mu/min    Pain meds: Epidural in place     GBS: Negative    Assessment:   36 y.o. [id number] at [redacted]w[redacted]d  1. IOL for PD, AMA, fetal echogenic bowel noted on [place] on 4/5, active labor, progressing normally  2. ROM x 3 hours  3. FHR Category: Category II   4. VSS    Plan:     Maternal position change  CEFM  Re-examine in 30 minutes  Anticipate a vaginal birth    Plan of care discussed with patient and husband, patient in agreement, questions answered and addressed.     Signed by: Golda Acre, CNM 07/14/2021        OB

## 2021-07-15 MED ORDER — SENNOSIDES-DOCUSATE SODIUM 8.6-50 MG PO TABS
1.0000 | ORAL_TABLET | Freq: Every evening | ORAL | Status: DC | PRN
Start: 2021-07-15 — End: 2024-01-01

## 2021-07-15 MED ORDER — LANOLIN EX OINT
TOPICAL_OINTMENT | CUTANEOUS | Status: AC | PRN
Start: 2021-07-15 — End: ?

## 2021-07-15 MED ORDER — IBUPROFEN 600 MG PO TABS
600.0000 mg | ORAL_TABLET | Freq: Four times a day (QID) | ORAL | Status: AC
Start: 2021-07-15 — End: ?

## 2021-07-15 NOTE — Consults (Signed)
Visited this P3 pt to offer lactation support. She breast fed/pumped for her older two children now 36 years old and 61 years old for a year. She plans on breastfeeding this baby. Pt denies a hx of medical conditions that could have the potential to impair her ability to create a full milk supply. Pt reports breast changes with pregnancy and denies previous breast surgeries or biopsies. Pt reports minimal soreness/scabbing on her nipples. She also feels like her milk is starting to come in because her breasts are feeling fuller.     Baby was sleeping on mom and she had just fed him at 3 pm.  Diaper output was adequate for second day of life; wt loss 3%.    Reviewed recommended 8-12 feedings/24 hours, feeding x 20 mins from each breast at each feeding session, waking techniques to encourage baby to remain more awake and engaged throughout the feeding, normal newborn behaviors, feeding cues, the benefits of skin-to-skin, and how to know if baby is getting enough/diaper output requirements per days of life. Encouraged to call PRN or for a latch and positioning check if she would like before she discharges home.

## 2021-07-15 NOTE — Discharge Summary (Signed)
 OB POSTPARTUM DISCHARGE SUMMARY    Subjective:  Delivery Type: Vaginal  Uncomplicated postpartum course  Minimal Bleeding, breastfeeding well  Baby doing well  Ready for discharge today    Objective:  Vital Signs Stable;   Vitals:    07/15/21 0807   BP: 104/71   Pulse: 72   Resp:    Temp: 98.1 F (36.7 C)   SpO2: 97%     Breast soft, non tender;   Fundus firm, non tender;  Extremities WNL    Assessment/Plan:  Stable - Postpartum Day 1  Discharge home  Follow up in 6 weeks  Call with fever, increasing and severe pain, heavy bleeding, depression, any questions or concerns  Take Ibuprofen PRN  PNV daily  Written and verbal instructions given    Signed by: Golda Acre, CNM 07/15/2021

## 2021-07-15 NOTE — Nursing Progress Note (Signed)
Discharge order entered by MD.  Vaccine screening completed.  Reviewed discharge instructions with pt and s/s of when to call the healthcare provider. Questions were answered and pt verbalized understanding.  Mom Boyd'd

## 2021-07-15 NOTE — Discharge Instr - AVS First Page (Addendum)
Reason for your Hospital Admission:  Labor and Delivery      Instructions for after your discharge:  After Your Delivery Discharge Instructions    After Discharge Orders:  Follow up in 6 weeks     After your delivery - signs and symptoms to watch for:  Fever - Oral temperature greater than 100.4 degrees Fahrenheit  Foul-smelling vaginal discharge  Headache unrelieved by "pain meds"  Difficulty urinating  Breasts reddened, hard, hot to the touch  Nipple discharge which is foul-smelling or contains pus  Increased pain at the site of the laceration  Difficulty breathing with or without chest pain  New calf pain especially if only on one side  Sudden, continuing increased vaginal bleeding with or without clots  Unrelieved feelings of:  Inability to cope  Sadness  Anxiety  Lack of interest in baby  Insomnia  Crying     What to do at home:  See patient education handouts for full information  Resume activity gradually   Don't lift anything heavier than baby and carrier until OK'd by your Physician or Midwife  No sex until OK'd by your Physician or Midwife  Take care of yourself by sleeping/resting as much as possible  Eat regular nutritious meals  Let someone else care for you, your baby, and housework as much as possible   Take pain medication as prescribed whenever you need them  Wear compression stockings if prescribed   To avoid/relieve constipation take stool softeners if advised   Drink lots of water/fruit juices  Increase fiber in your diet  Breast care: Wear support bra 24/7; use Lanolin as needed     Refer to Newborn Discharge Instructions for any concerns

## 2021-07-22 ENCOUNTER — Ambulatory Visit: Payer: Self-pay

## 2021-07-22 NOTE — Lactation Note (Signed)
This note was copied from a baby's chart.  Initial Lactation Visit:    Assisted baby to be undressed and placed skin to skin. Infant deeply latched on the left breast and sustained a latch for six minutes and then was sleepy with all attempts to re-latch and continue feeding. Instructed on sandwiching the breast, lining the nipple up with the nose with bringing the chin to the breast first, and moving fingers back towards the chest wall to allow for deepest, most effective latch. However, infant remained sleepy.  Mother of baby stated that infant does not sustain a latch for more than five minutes and throws up approximately twenty minutes after feedings since birth up until today.    Reviewed breastfeeding basics including feeding and sleep patterns.  Watch baby for signs of effective feedings (adequate number of voids, stools and minimal weight loss). Since infant is not sufficiently feeding from the breast it is recommended that mother includes a supplement with her pumped breast-milk to make sure infant is feeding adequately and mother's milk supply is protected. Mother agrees to supplement with a bottle of breast-milk in between breastfeeding sessions.     Patient is using HGP, Hospital Grade Pump, with 25mm flange size. Reviewed cyclical pump settings, patient verbalized understanding. Patient stated that she has been pumping every three hours for fifteen minutes for each pumping session. Encouraged to continue pumping every three hours. Patient has no questions on pumping.     Educated on paced bottle feedings which will slow down the flow of milk into the infant's mouth, allowing the baby to eat more slowly, and take breaks. Mother verbalized understanding. Educated on keeping infant up right position and burping as well. Mother have no further questions at this time.     Contact number on the board.   Encouraged to call for any questions.   Follow up tomorrow and as needed.

## 2021-07-23 ENCOUNTER — Ambulatory Visit: Payer: Self-pay

## 2021-07-23 NOTE — Lactation Note (Signed)
This note was copied from a baby's chart.  Follow-up via telephone with patient mother permission:     Mother reports baby is improving, but not back to baseline. Still doing breast AND bottle. Still pumping. Feels comfortable and confident with current progress and plan.     May discharge home today. If not, patient may reach out tomorrow for follow-up visit PRN.     F/U PRN.

## 2022-09-19 ENCOUNTER — Other Ambulatory Visit (INDEPENDENT_AMBULATORY_CARE_PROVIDER_SITE_OTHER): Payer: Self-pay

## 2022-10-25 ENCOUNTER — Other Ambulatory Visit (INDEPENDENT_AMBULATORY_CARE_PROVIDER_SITE_OTHER): Payer: Self-pay

## 2023-04-26 ENCOUNTER — Telehealth (INDEPENDENT_AMBULATORY_CARE_PROVIDER_SITE_OTHER): Payer: Self-pay | Admitting: Family Medicine

## 2023-04-26 NOTE — Telephone Encounter (Signed)
 Called pt on 01/16 @ 1;30 No answer Left detailed VM that provider will not be in office and need to reschedule appt .

## 2023-04-27 ENCOUNTER — Ambulatory Visit (INDEPENDENT_AMBULATORY_CARE_PROVIDER_SITE_OTHER): Payer: BC Managed Care – PPO | Admitting: Family Medicine

## 2023-12-31 ENCOUNTER — Encounter (INDEPENDENT_AMBULATORY_CARE_PROVIDER_SITE_OTHER): Payer: Self-pay | Admitting: Family Medicine

## 2024-01-01 ENCOUNTER — Encounter (INDEPENDENT_AMBULATORY_CARE_PROVIDER_SITE_OTHER): Payer: Self-pay | Admitting: Family Medicine

## 2024-01-01 ENCOUNTER — Other Ambulatory Visit (FREE_STANDING_LABORATORY_FACILITY)

## 2024-01-01 ENCOUNTER — Ambulatory Visit (INDEPENDENT_AMBULATORY_CARE_PROVIDER_SITE_OTHER): Admitting: Family Medicine

## 2024-01-01 VITALS — BP 116/72 | HR 59 | Resp 16 | Ht 62.0 in | Wt 128.3 lb

## 2024-01-01 DIAGNOSIS — Z0000 Encounter for general adult medical examination without abnormal findings: Secondary | ICD-10-CM

## 2024-01-01 DIAGNOSIS — Z1331 Encounter for screening for depression: Secondary | ICD-10-CM

## 2024-01-01 LAB — COMPREHENSIVE METABOLIC PANEL
ALT: 12 U/L (ref <=55)
AST (SGOT): 26 U/L (ref <=41)
Albumin/Globulin Ratio: 1.8 (ref 0.9–2.2)
Albumin: 4.6 g/dL (ref 3.5–4.9)
Alkaline Phosphatase: 49 U/L (ref 37–117)
Anion Gap: 10 (ref 5.0–15.0)
BUN: 6 mg/dL — ABNORMAL LOW (ref 7–21)
Bilirubin, Total: 0.7 mg/dL (ref 0.2–1.2)
CO2: 27 meq/L (ref 17–29)
Calcium: 9.7 mg/dL (ref 8.5–10.5)
Chloride: 101 meq/L (ref 99–111)
Creatinine: 0.7 mg/dL (ref 0.4–1.0)
GFR: >60 mL/min/1.73 m2 (ref >=60.0)
Globulin: 2.5 g/dL (ref 2.0–3.6)
Glucose: 86 mg/dL (ref 70–100)
Hemolysis Index: 18 {index}
Potassium: 4.3 meq/L (ref 3.5–5.3)
Protein, Total: 7.1 g/dL (ref 6.0–8.3)
Sodium: 138 meq/L (ref 135–145)

## 2024-01-01 LAB — LAB USE ONLY - CBC WITH DIFFERENTIAL
Absolute Basophils: 0.07 x10 3/uL (ref 0.00–0.08)
Absolute Eosinophils: 0.06 x10 3/uL (ref 0.00–0.44)
Absolute Immature Granulocytes: 0.03 x10 3/uL (ref 0.00–0.07)
Absolute Lymphocytes: 1.06 x10 3/uL (ref 0.42–3.22)
Absolute Monocytes: 0.57 x10 3/uL (ref 0.21–0.85)
Absolute Neutrophils: 5.3 x10 3/uL (ref 1.10–6.33)
Absolute nRBC: 0.02 x10 3/uL — ABNORMAL HIGH (ref <=0.00)
Basophils %: 1 %
Eosinophils %: 0.8 %
Hematocrit: 44.4 % — ABNORMAL HIGH (ref 34.7–43.7)
Hemoglobin: 14.9 g/dL — ABNORMAL HIGH (ref 11.4–14.8)
Immature Granulocytes %: 0.4 %
Lymphocytes %: 15 %
MCH: 31.1 pg (ref 25.1–33.5)
MCHC: 33.6 g/dL (ref 31.5–35.8)
MCV: 92.7 fL (ref 78.0–96.0)
MPV: 10.4 fL (ref 8.9–12.5)
Monocytes %: 8 %
Neutrophils %: 74.8 %
Platelet Count: 233 x10 3/uL (ref 142–346)
Preliminary Absolute Neutrophil Count: 5.3 x10 3/uL (ref 1.10–6.33)
RBC: 4.79 x10 6/uL (ref 3.90–5.10)
RDW: 12 % (ref 11–15)
WBC: 7.09 x10 3/uL (ref 3.10–9.50)
nRBC %: 0.3 /100{WBCs} — ABNORMAL HIGH (ref <=0.0)

## 2024-01-01 LAB — LIPID PANEL
Cholesterol / HDL Ratio: 2.2 {index}
Cholesterol: 149 mg/dL (ref <=199)
HDL: 67 mg/dL (ref >=40)
LDL Calculated: 65 mg/dL (ref 0–99)
Triglycerides: 87 mg/dL (ref 34–149)
VLDL Calculated: 17 mg/dL (ref 10–40)

## 2024-01-01 LAB — HEMOGLOBIN A1C
Average Estimated Glucose: 102.5 mg/dL
Hemoglobin A1C: 5.2 % (ref 4.6–5.6)

## 2024-01-01 LAB — THYROID STIMULATING HORMONE (TSH) WITH REFLEX TO FREE T4: TSH: 1.19 u[IU]/mL (ref 0.35–4.94)

## 2024-01-01 NOTE — Progress Notes (Signed)
 [facility] [place]                       Date of Exam: 01/01/2024 11:10 AM        Patient ID: [name] [name] is a 38 y.o. female.  Attending Physician: Georg CHRISTELLA Hoard, DO        Chief Complaint:    Chief Complaint   Patient presents with    Annual Exam     Not-fasting  [facility]: 12/12/2023   Pap smear: 2024        Subjective        HPI:    [name] [name] is a 38 y.o. female new patient no reported PMH who presents for her annual physical exam.    Reported Health: good health  Interval Events: none    Menstrual hx: LMP: 12/12/23  Relationship status: Married  STD concerns: denies knowledge of risky exposure  Contraception: None     Health Screenings:   >Cervical Cancer Screening: reports last pap smear was normal within the last 5 years. Follows w/ OBGYN     >Breast Cancer Screening:  No family history of first degree relatives w/ breast cancer      >Colon CA screening: No previous screening. No family history of first degree relative w/ colon cancer     Vision: Wears glasses/ contacts. Visits with eye specialist   Hearing: No concerns  Dental: Visits with dentist 1-2x/ year    Lifestyle:  Work Status: working full-time  Diet: Vegan   Exercise: 5 days/ week   Mood: is ok           Problem List:    Problem List[1]          Current Meds:    Medications Taking[2]       Allergies:    Allergies[3]          Past Surgical History:    Past Surgical History[4]        Family History:    Family History[5]            The following sections were reviewed this encounter by the provider:   Tobacco  Allergies  Meds  Problems  Med Hx  Surg Hx  Fam Hx  Soc Hx              ROS:    Negative unless reported in HPI       Objective    Vital Signs:    BP 116/72 (BP Site: Left arm, Patient Position: Sitting, Cuff Size: Small)   Pulse (!) 59   Resp 16   Ht 1.575 m (5' 2)   Wt 58.2 kg (128 lb 4.8 oz)   SpO2 98%   Breastfeeding No   BMI 23.47 kg/m              Physical Exam:    Physical Exam     General Appearance:  Well appearing, well developed, well hydrated, good color, and in no acute distress  Head: Normocephalic atraumatic  Eyes: Pupils equal/round/reactive to light, no scleral icterus, no erythema, no discharge  Ears: Normal external shape, normal position, normal tympanic membranes, tympanic membranes flat, and normal landmarks  Nose: Nares patent and no discharge  Mouth: Moist mucous membranes, tongue normal, palate normal, no oropharyngeal erythema  Neck: Supple, no cervical lymphadenopathy  Lungs: CTA bilaterally, no wheezing, and good air entry  Heart: Regular rate and regular rhythm, no murmur  Abdomen: soft,  non-tender, non-distended  Musculoskeletal: Moves all 4 extremities  Extremities: Symmetric, no edema   Neurologic: Alert/appropriate, normal strength, normal tone, clear speech  Skin: No rash to visible skin  Psych: Mood congruent affect, responds appropriately to questions.      Performed with patient's verbal consent:    Breast: No skin changes. No dimpling. No dominant masses or unusual tenderness           Assessment & Plan     Assessment / Plan:    1. Encounter for wellness examination  2. Screening for depression    Based on reviewed health maintenance status/history, patient is due for:    Screenings:  BP WNL today  Diabetes: A1c ordered  Hyperlipidemia: Lipid panel ordered  STD screening: Not indicated   Depression screen: I reviewed the PHQ2 questionnaire. PHQ2 scoring:0  Negative  Pap smear: UTD follows w/ OBGYN     Based on reviewed immunization status, patient is due for:  Flu vaccine - deferred    - CBC with Differential (Order); Future  - Comprehensive Metabolic Panel; Future  - Follow Up In Primary Care; Future  - Hemoglobin A1C; Future  - Lipid Panel; Future  - Thyroid  Stimulating Hormone (TSH) with Reflex to Free T4; Future      Health Maintenance:  Recommend optimizing low carbohydrate diet efforts and obtaining at least 150 minutes of aerobic exercise per week. Recommend 20-25 grams of  dietary fiber daily. Recommend drinking at least 60-80 ounces of water per day. Recommend optimizing low sodium diet measures ( less than 2 grams of sodium in the diet per day ).  Counseling/Anticipatory Guidance: nutrition, physical activity, healthy weight, injury prevention, misuse of tobacco, alcohol and drugs, sexual behavior and STDs, dental health, mental health, immunizations.               Follow-up:    Follow up in 1 year for annual physical       [name] [name] M [name] Lagunes, DO              [1]   Patient Active Problem List  Diagnosis   (none) - all problems resolved or deleted   [2]   Outpatient Medications Marked as Taking for the 01/01/24 encounter (Office Visit) with Kasondra Junod M, DO   Medication Sig Dispense Refill    ibuprofen  (ADVIL ) 600 MG tablet Take 1 tablet (600 mg) by mouth every 6 (six) hours     [3] No Active Allergies  [4]   Past Surgical History:  Procedure Laterality Date    D & C  06/2020    no past surgical history     [5]   Family History  Problem Relation Name Age of Onset    Hypertension Mother      Hyperlipidemia Mother      Heart attack Father  27    Prostate cancer Father      Crohn's disease Sister      No known problems Brother      Breast cancer Paternal Grandmother

## 2024-01-01 NOTE — Patient Instructions (Signed)
 Thank you for visiting us  at [facility]!    Health Maintenance:  - I recommend optimizing low carbohydrate diet efforts and obtaining at least 150 minutes of aerobic exercise per week. Please try to incorporate 20-25 grams of dietary fiber daily  - Please try to drink 60-80 ounces of water  per day. I recommend optimizing low sodium diet measures (less than 2 grams of sodium in the diet per day).    Understanding the Mediterranean Diet  A Mediterranean-style diet is a healthy way of eating, not a specific diet to lose weight. It includes a lot of foods from plants such as vegetables, fruits, and whole grains, plus olive oil and seafood. It also includes dairy foods and meats, but in smaller amounts. And it includes a moderate amount of wine. Many studies over time have shown health benefits to eating this way. It focuses on making fresh food that's full of flavor.  This plan of eating is inspired by how people eat in countries around the [employer]. They include [place], [place], [place], [place], [place], [place]. But it uses foods you can buy in almost any grocery store.  Health benefits of a Mediterranean diet  This diet is high in fiber, lean protein, and healthy oils. It's low in saturated fats and sugar. The diet has been shown to help prevent ormanage:  Depression  Diabetes  Heart disease  High blood pressure  Parkinson disease  Alzheimer disease  Cancers of the colon, prostate, and breast  What do I eat on a Mediterranean diet?  Plan each meal around vegetables and whole grains. Use olive oil. Add nuts andlegumes. Include fish or lean protein. Foods to focus your meals around include:  Food type What to eat   Vegetables This includes leafy greens, tomatoes, squash, peppers, cucumbers, green beans, eggplant, avocados, potatoes, and olives. You can use fresh or frozenvegetables.   Fruits This includes apples, raspberries, strawberries, grapes, citrus fruit such as oranges and grapefruit,  stone fruit such as apricots and peaches, plus figs,dates, and melon.   Whole grains This includes brown rice, whole oats, quinoa, millet, whole grain bread,whole-wheat pasta, and crackers made with whole grains.   Beans and legumes These include lentils, chickpeas, and beans such as pinto, fava, kidney, andblack beans. Peanuts are also legumes.   Seafood This includes fish such as salmon, trout, mackerel, haddock, tuna, sardines, anchovies, and whitefish. It also includes shellfish such as shrimp, oysters,mussels, and clams.   Nuts and seeds These include walnuts, almonds, sunflower seeds, cashews, [place] nuts, andpecans.   Healthy oil Olive oil is the most common oil in the Mediterranean diet. But other healthyoils are canola, sunflower, safflower, and corn oils.   Herbs and spices Season food with oregano, pepper, sage, tarragon, thyme, basil, cinnamon, andcumin.   Wine Enjoy a glass of wine each day with a meal. Skip this if you need to not havealcohol for any reason.   Foods to eat in smaller amounts  You can add these foods in a few days a week, in smaller amounts:  Dairy foods, such as cheese, yogurt, and butter  Poultry, such as chicken and duck  Eggs  Occasional treats  Limit these foods in your weekly eating plan:  Red meats, such as beef, lamb, and pork  Refined grains, such as white rice and foods made with white flour  Sugary treats, such as chocolate, candy, or pastries  Adding lots of flavor  You can liven up fresh foods with many  kinds of flavor. Try these sauces, dips,and seasonings:  Hummus  Marinara sauce  Salsa  Vinaigrette dressing  Tips for eating out  At restaurants:  Skip fried foods. These have a lot of saturated fat.  Look for fish dishes that are cooked without cream or butter.  Pick salads that have nuts and seeds.  Choose vegetarian options that don't have too much cheese.  [employer]Well last reviewed this educational content on5/04/2018       Please contact our office if you have any  questions regarding your visit.

## 2024-01-08 ENCOUNTER — Ambulatory Visit (INDEPENDENT_AMBULATORY_CARE_PROVIDER_SITE_OTHER): Payer: Self-pay | Admitting: Family Medicine
